# Patient Record
Sex: Male | Born: 1955 | Race: White | Hispanic: No | Marital: Married | Smoking: Never smoker
Health system: Southern US, Community
[De-identification: ages and names within clinical notes are randomized; demographics above are authoritative.]

## PROBLEM LIST (undated history)

## (undated) DIAGNOSIS — I739 Peripheral vascular disease, unspecified: Secondary | ICD-10-CM

## (undated) DIAGNOSIS — I82409 Acute embolism and thrombosis of unspecified deep veins of unspecified lower extremity: Secondary | ICD-10-CM

## (undated) DIAGNOSIS — K219 Gastro-esophageal reflux disease without esophagitis: Secondary | ICD-10-CM

## (undated) DIAGNOSIS — H269 Unspecified cataract: Secondary | ICD-10-CM

## (undated) DIAGNOSIS — E785 Hyperlipidemia, unspecified: Secondary | ICD-10-CM

## (undated) HISTORY — PX: CATARACT EXTRACTION: SUR2

## (undated) HISTORY — DX: Hyperlipidemia, unspecified: E78.5

## (undated) HISTORY — PX: OTHER SURGICAL HISTORY: SHX169

## (undated) HISTORY — PX: WISDOM TOOTH EXTRACTION: SHX21

## (undated) HISTORY — PX: UMBILICAL HERNIA REPAIR: SHX196

## (undated) HISTORY — PX: HAND SURGERY: SHX662

---

## 2006-07-14 ENCOUNTER — Ambulatory Visit (HOSPITAL_BASED_OUTPATIENT_CLINIC_OR_DEPARTMENT_OTHER): Admission: RE | Admit: 2006-07-14 | Discharge: 2006-07-14 | Payer: Self-pay | Admitting: Surgery

## 2014-12-14 ENCOUNTER — Other Ambulatory Visit: Payer: Self-pay | Admitting: Gastroenterology

## 2014-12-14 DIAGNOSIS — R1310 Dysphagia, unspecified: Secondary | ICD-10-CM

## 2014-12-20 ENCOUNTER — Ambulatory Visit
Admission: RE | Admit: 2014-12-20 | Discharge: 2014-12-20 | Disposition: A | Discharge status: Home / Self Care | Payer: Commercial Managed Care - PPO | Source: Ambulatory Visit | Attending: Gastroenterology | Admitting: Gastroenterology

## 2014-12-20 DIAGNOSIS — R1310 Dysphagia, unspecified: Secondary | ICD-10-CM

## 2015-12-25 ENCOUNTER — Ambulatory Visit (INDEPENDENT_AMBULATORY_CARE_PROVIDER_SITE_OTHER): Payer: Commercial Managed Care - PPO

## 2015-12-25 ENCOUNTER — Encounter: Payer: Self-pay | Admitting: Podiatry

## 2015-12-25 ENCOUNTER — Ambulatory Visit (INDEPENDENT_AMBULATORY_CARE_PROVIDER_SITE_OTHER): Payer: Commercial Managed Care - PPO | Admitting: Podiatry

## 2015-12-25 VITALS — BP 157/91 | HR 83 | Resp 16 | Ht 76.0 in | Wt 230.0 lb

## 2015-12-25 DIAGNOSIS — M722 Plantar fascial fibromatosis: Secondary | ICD-10-CM | POA: Diagnosis not present

## 2015-12-25 DIAGNOSIS — M79671 Pain in right foot: Secondary | ICD-10-CM

## 2015-12-25 DIAGNOSIS — B351 Tinea unguium: Secondary | ICD-10-CM

## 2015-12-25 MED ORDER — TRIAMCINOLONE ACETONIDE 10 MG/ML IJ SUSP
10.0000 mg | Freq: Once | INTRAMUSCULAR | Status: AC
  Administered 2015-12-25: 10 mg

## 2015-12-25 MED ORDER — TERBINAFINE HCL 250 MG PO TABS: 250.0000 mg | ORAL_TABLET | Freq: Every day | ORAL | Status: DC

## 2015-12-25 NOTE — Progress Notes (Signed)
Subjective:     Patient ID: Don Massey, male   DOB: Sep 29, 1956, 60 y.o.   MRN: [id number]  HPI patient states I've had pain in my right heel for around 6 months and I have dry skin and some nail disease bilateral   Review of Systems  All other systems reviewed and are negative.      Objective:   Physical Exam  Constitutional: He is oriented to person, place, and time.  Cardiovascular: Intact distal pulses.   Musculoskeletal: Normal range of motion.  Neurological: He is oriented to person, place, and time.  Skin: Skin is warm.  Nursing note and vitals reviewed.  neurovascular status intact muscle strength adequate range of motion within normal limits with patient found to have exquisite discomfort plantar aspect right heel at the insertional point tendon calcaneus with fluid buildup around the medial band and also is noted to have dry skin bilateral and nail disease with yellow thick discoloration of all nails on both feet.     Assessment:     Long-term plantar fasciitis right along with mycotic nail and skin disease bilateral    Plan:     H&P and both conditions reviewed. I discussed both conditions thoroughly and at this time I injected the right plantar fascia 3 mg Kenalog 5 mill grams Xylocaine and applied fascial brace and gave instructions on physical therapy. He is just have liver function studies which were normal and he wants to go ahead and do oral medication we will utilize 60 days of oral Lamisil to try to control skin and improve nails. If any problems were to occur he is to let us know immediately and we'll see him back in 3 weeks for checking his right heel  X-ray report indicate spur formation mild depression of arch with no indication of stress fracture

## 2015-12-25 NOTE — Patient Instructions (Signed)

## 2015-12-25 NOTE — Progress Notes (Signed)
   Subjective:    Patient ID: Don Massey, male    DOB: 1956-05-06, 60 y.o.   MRN: [id number]  HPI Patient presents with foot pain in their Right foot; heel; medial side. Pt stated, "has discomfort that started in the heel and now other parts of foot hurt"; Since Sept. 2016.   Review of Systems  All other systems reviewed and are negative.      Objective:   Physical Exam        Assessment & Plan:

## 2016-01-15 ENCOUNTER — Ambulatory Visit (INDEPENDENT_AMBULATORY_CARE_PROVIDER_SITE_OTHER): Payer: Commercial Managed Care - PPO | Admitting: Podiatry

## 2016-01-15 ENCOUNTER — Encounter: Payer: Self-pay | Admitting: Podiatry

## 2016-01-15 VITALS — BP 161/88 | HR 67 | Resp 29

## 2016-01-15 DIAGNOSIS — M779 Enthesopathy, unspecified: Secondary | ICD-10-CM

## 2016-01-15 DIAGNOSIS — M722 Plantar fascial fibromatosis: Secondary | ICD-10-CM

## 2016-01-15 MED ORDER — TRIAMCINOLONE ACETONIDE 10 MG/ML IJ SUSP
10.0000 mg | Freq: Once | INTRAMUSCULAR | Status: AC
  Administered 2016-01-15: 10 mg

## 2016-01-15 NOTE — Progress Notes (Signed)
Subjective:     Patient ID: Don Massey, male   DOB: [birth year], 60 y.o.   MRN: [id number]  HPI patient states I'm doing better but I'm having pain on the outside of my ankle now that has become more of a problem   Review of Systems     Objective:   Physical Exam Neurovascular status intact with discomfort in the lateral ankle peroneal tendon group with inflammation fluid buildup noted with significant diminishment of plantar fascial pain    Assessment:     Doing excellent with plantar fasciitis with tendinitis lateral    Plan:     Reviewed both conditions and also skin conditions with continued Lamisil usage for the next 45 days. Careful injection administered outside right foot 3 Milligan Kenalog 5 mill grams Xylocaine and advised on ice therapy reduced activity and returning to normal activity within the next several weeks. Patient will be seen back to recheck

## 2016-11-06 DIAGNOSIS — D166 Benign neoplasm of vertebral column: Secondary | ICD-10-CM | POA: Diagnosis not present

## 2016-11-06 DIAGNOSIS — D126 Benign neoplasm of colon, unspecified: Secondary | ICD-10-CM | POA: Diagnosis not present

## 2016-11-06 DIAGNOSIS — D123 Benign neoplasm of transverse colon: Secondary | ICD-10-CM | POA: Diagnosis not present

## 2016-11-06 DIAGNOSIS — Z1211 Encounter for screening for malignant neoplasm of colon: Secondary | ICD-10-CM | POA: Diagnosis not present

## 2016-11-06 DIAGNOSIS — D122 Benign neoplasm of ascending colon: Secondary | ICD-10-CM | POA: Diagnosis not present

## 2017-03-10 DIAGNOSIS — K219 Gastro-esophageal reflux disease without esophagitis: Secondary | ICD-10-CM | POA: Diagnosis not present

## 2017-03-10 DIAGNOSIS — R091 Pleurisy: Secondary | ICD-10-CM | POA: Diagnosis not present

## 2017-03-10 DIAGNOSIS — R0789 Other chest pain: Secondary | ICD-10-CM | POA: Diagnosis not present

## 2017-05-25 DIAGNOSIS — K439 Ventral hernia without obstruction or gangrene: Secondary | ICD-10-CM | POA: Diagnosis not present

## 2017-06-22 DIAGNOSIS — Z23 Encounter for immunization: Secondary | ICD-10-CM | POA: Diagnosis not present

## 2017-08-16 ENCOUNTER — Telehealth: Payer: Self-pay | Admitting: *Deleted

## 2017-08-16 MED ORDER — TERBINAFINE HCL 250 MG PO TABS: ORAL_TABLET | ORAL | 0 refills | Status: DC

## 2017-08-16 NOTE — Telephone Encounter (Signed)
Pt states 12/2015 Dr. Paulla Dolly prescribed lamisil #60 and said he may have to periodically take and pt request refill. Dr. Paulla Dolly ordered Lamisil pulse pak. Orders to [facility] and left message informing pt.

## 2017-09-21 DIAGNOSIS — Z79899 Other long term (current) drug therapy: Secondary | ICD-10-CM | POA: Diagnosis not present

## 2017-09-21 DIAGNOSIS — K219 Gastro-esophageal reflux disease without esophagitis: Secondary | ICD-10-CM | POA: Diagnosis not present

## 2017-09-21 DIAGNOSIS — E7800 Pure hypercholesterolemia, unspecified: Secondary | ICD-10-CM | POA: Diagnosis not present

## 2017-09-21 DIAGNOSIS — Z0000 Encounter for general adult medical examination without abnormal findings: Secondary | ICD-10-CM | POA: Diagnosis not present

## 2018-01-07 DIAGNOSIS — L821 Other seborrheic keratosis: Secondary | ICD-10-CM | POA: Diagnosis not present

## 2018-01-07 DIAGNOSIS — L820 Inflamed seborrheic keratosis: Secondary | ICD-10-CM | POA: Diagnosis not present

## 2018-01-07 DIAGNOSIS — D225 Melanocytic nevi of trunk: Secondary | ICD-10-CM | POA: Diagnosis not present

## 2018-01-07 DIAGNOSIS — D1801 Hemangioma of skin and subcutaneous tissue: Secondary | ICD-10-CM | POA: Diagnosis not present

## 2018-04-19 ENCOUNTER — Encounter (HOSPITAL_BASED_OUTPATIENT_CLINIC_OR_DEPARTMENT_OTHER): Payer: Self-pay

## 2018-04-19 ENCOUNTER — Other Ambulatory Visit: Payer: Self-pay

## 2018-04-19 ENCOUNTER — Emergency Department (HOSPITAL_BASED_OUTPATIENT_CLINIC_OR_DEPARTMENT_OTHER): Payer: Commercial Managed Care - PPO

## 2018-04-19 ENCOUNTER — Emergency Department (HOSPITAL_BASED_OUTPATIENT_CLINIC_OR_DEPARTMENT_OTHER)
Admission: EM | Admit: 2018-04-19 | Discharge: 2018-04-19 | Disposition: A | Discharge status: Home / Self Care | Payer: Commercial Managed Care - PPO | Attending: Emergency Medicine | Admitting: Emergency Medicine

## 2018-04-19 DIAGNOSIS — I8002 Phlebitis and thrombophlebitis of superficial vessels of left lower extremity: Secondary | ICD-10-CM | POA: Insufficient documentation

## 2018-04-19 DIAGNOSIS — I809 Phlebitis and thrombophlebitis of unspecified site: Secondary | ICD-10-CM

## 2018-04-19 DIAGNOSIS — Z79899 Other long term (current) drug therapy: Secondary | ICD-10-CM | POA: Diagnosis not present

## 2018-04-19 DIAGNOSIS — M79605 Pain in left leg: Secondary | ICD-10-CM | POA: Diagnosis present

## 2018-04-19 DIAGNOSIS — I82812 Embolism and thrombosis of superficial veins of left lower extremities: Secondary | ICD-10-CM | POA: Diagnosis not present

## 2018-04-19 MED ORDER — CEPHALEXIN 500 MG PO CAPS: 500.0000 mg | ORAL_CAPSULE | Freq: Four times a day (QID) | ORAL | 0 refills | Status: DC

## 2018-04-19 NOTE — ED Triage Notes (Signed)
Pt c/o redness, pain to left LE day 2-sent from [facility] PCP-NAD-steady gait
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
## 2018-04-19 NOTE — Discharge Instructions (Signed)
Take 4 over the counter ibuprofen tablets 3 times a day or 2 over-the-counter naproxen tablets twice a day for pain. Also take tylenol 1000mg(2 extra strength) four times a day.    

## 2018-04-19 NOTE — ED Notes (Signed)
Patient transported to Ultrasound 

## 2018-05-01 ENCOUNTER — Emergency Department (HOSPITAL_COMMUNITY)
Admission: EM | Admit: 2018-05-01 | Discharge: 2018-05-01 | Disposition: A | Discharge status: Home / Self Care | Payer: Commercial Managed Care - PPO | Attending: Emergency Medicine | Admitting: Emergency Medicine

## 2018-05-01 ENCOUNTER — Encounter (HOSPITAL_COMMUNITY): Payer: Self-pay | Admitting: Emergency Medicine

## 2018-05-01 ENCOUNTER — Emergency Department (HOSPITAL_BASED_OUTPATIENT_CLINIC_OR_DEPARTMENT_OTHER): Payer: Commercial Managed Care - PPO

## 2018-05-01 DIAGNOSIS — M79609 Pain in unspecified limb: Secondary | ICD-10-CM | POA: Diagnosis not present

## 2018-05-01 DIAGNOSIS — M79604 Pain in right leg: Secondary | ICD-10-CM | POA: Diagnosis not present

## 2018-05-01 DIAGNOSIS — R202 Paresthesia of skin: Secondary | ICD-10-CM

## 2018-05-01 DIAGNOSIS — Z79899 Other long term (current) drug therapy: Secondary | ICD-10-CM | POA: Insufficient documentation

## 2018-05-01 DIAGNOSIS — I8001 Phlebitis and thrombophlebitis of superficial vessels of right lower extremity: Secondary | ICD-10-CM | POA: Insufficient documentation

## 2018-05-01 MED ORDER — NAPROXEN 500 MG PO TABS: 500.0000 mg | ORAL_TABLET | Freq: Two times a day (BID) | ORAL | 0 refills | Status: DC

## 2018-05-01 NOTE — Discharge Instructions (Signed)
Please read and follow all provided instructions.  Your diagnoses today include:  1. Thrombophlebitis of superficial veins of right lower extremity   2. Left leg paresthesias     Tests performed today include:  Ultrasound of your right leg -superficial thrombophlebitis, no deep or blood clots  Vital signs. See below for your results today.   Medications prescribed:   Naproxen - anti-inflammatory pain medication  Do not exceed 500mg  naproxen every 12 hours, take with food  You have been prescribed an anti-inflammatory medication or NSAID. Take with food. Take smallest effective dose for the shortest duration needed for your pain. Stop taking if you experience stomach pain or vomiting.   Take any prescribed medications only as directed.  Home care instructions:  Follow any educational materials contained in this packet.  Follow-up instructions: Please follow-up with your primary care provider in the next 7 days for further evaluation of your symptoms.   Return instructions:   Please return to the Emergency Department if you experience worsening symptoms.   Please return if you have any other emergent concerns.  Additional Information:  Your vital signs today were: BP (!) 149/82 (BP Location: Left Arm)    Pulse 64    Temp 98.3 F (36.8 C) (Oral)    Resp 18    SpO2 99%  If your blood pressure (BP) was elevated above 135/85 this visit, please have this repeated by your doctor within one month. --------------

## 2018-05-01 NOTE — Progress Notes (Signed)
RLE venous duplex prelim: negative for DVT. Superficial thrombosis noted in isolated varicose vein, lower right calf. Landry Mellow, RDMS, RVT

## 2018-05-01 NOTE — ED Triage Notes (Signed)
Patient here from home with complaints of left leg redness, swelling, and tingling. Seen at [facility] PCP, referred here for Doppler study.

## 2018-05-01 NOTE — ED Provider Notes (Signed)
[facility] DEPT Provider Note   CSN: [id number] Arrival date & time: 05/01/18  1342     History   Chief Complaint Chief Complaint  Patient presents with  . Leg Pain    HPI Don Massey is a 62 y.o. male.  Patient with history of high cholesterol, chronic varicose veins --presents the emergency department today with complaint of right leg tenderness and swelling.  Patient had an area of swelling on his left leg about 2 weeks ago.  He had a Doppler ultrasound performed showing a superficial thrombophlebitis.  He was treated with anti-inflammatories and heat with improvement.  Over the past 2 days he developed similar symptoms on the right side.  He was sent to the emergency department by his primary care doctor today for Doppler ultrasound.  Patient denies any chest pain or shortness of breath.  He is not anticoagulated and does not take aspirin.  Patient reports no recent surgeries or injuries to the areas.  He was recently on a car ride to and from [place] but reports stopping frequently to stretch. The onset of this condition was acute. The course is constant. Aggravating factors: palpation. Alleviating factors: none.       History reviewed. No pertinent past medical history.  There are no active problems to display for this patient.   History reviewed. No pertinent surgical history.      Home Medications    Prior to Admission medications   Medication Sig Start Date End Date Taking? Authorizing Provider  cephALEXin (KEFLEX) 500 MG capsule Take 1 capsule (500 mg total) by mouth 4 (four) times daily. 04/19/18   Deno Etienne, DO  omeprazole (PRILOSEC) 40 MG capsule Take 40 mg by mouth daily. 11/22/15   [provider]  rosuvastatin (CRESTOR) 10 MG tablet Take 10 mg by mouth 3 (three) times a week.    [provider]    Family History No family history on file.  Social History Social History   Tobacco Use  . Smoking status:  Never Smoker  . Smokeless tobacco: Never Used  Substance Use Topics  . Alcohol use: Yes    Alcohol/week: 0.0 oz    Comment: rare  . Drug use: Never     Allergies   Codeine   Review of Systems Review of Systems  Constitutional: Negative for fever.  HENT: Negative for rhinorrhea and sore throat.   Eyes: Negative for redness.  Respiratory: Negative for cough.   Cardiovascular: Positive for leg swelling. Negative for chest pain.  Gastrointestinal: Negative for abdominal pain, diarrhea, nausea and vomiting.  Genitourinary: Negative for dysuria.  Musculoskeletal: Positive for myalgias.  Skin: Negative for rash.  Neurological: Negative for headaches.     Physical Exam Updated Vital Signs BP (!) 149/82 (BP Location: Left Arm)   Pulse 64   Temp 98.3 F (36.8 C) (Oral)   Resp 18   SpO2 99%   Physical Exam  Constitutional: He appears well-developed and well-nourished.  HENT:  Head: Normocephalic and atraumatic.  Eyes: Conjunctivae are normal.  Neck: Normal range of motion. Neck supple.  Pulmonary/Chest: No respiratory distress.  Musculoskeletal: He exhibits tenderness. He exhibits no edema.  Patient with generalized venous stasis changes and varicose veins of both legs bilaterally.  Patient has an area of tenderness to the proximal calf medially with palpable cords.  No overlying erythema.  Neurological: He is alert.  Skin: Skin is warm and dry.  Psychiatric: He has a normal mood and affect.  Nursing  note and vitals reviewed.    ED Treatments / Results  Labs (all labs ordered are listed, but only abnormal results are displayed) Labs Reviewed - No data to display  EKG None  Radiology No results found.  Procedures Procedures (including critical care time)  Medications Ordered in ED Medications - No data to display   Initial Impression / Assessment and Plan / ED Course  I have reviewed the triage vital signs and the nursing notes.  Pertinent labs & imaging  results that were available during my care of the patient were reviewed by me and considered in my medical decision making (see chart for details).     Patient seen and examined. Doppler ordered.   Vital signs reviewed and are as follows: BP (!) 149/82 (BP Location: Left Arm)   Pulse 64   Temp 98.3 F (36.8 C) (Oral)   Resp 18   SpO2 99%   4:51 PM ultrasound demonstrates superficial thrombophlebitis.  Patient will be placed back on 10-day course of naproxen.  Counseled on other conservative measures.  Encourage PCP follow-up for further instructions.  Also discussed use of compression stockings given evidence of venous insufficiency.  Final Clinical Impressions(s) / ED Diagnoses   Final diagnoses:  Thrombophlebitis of superficial veins of right lower extremity  Left leg paresthesias   Right leg tenderness and swelling: Ultrasound demonstrates thrombophlebitis, no DVT.  Left leg tingling: No other signs of stroke.  No weakness.  Patient to monitor and follow-up with PCP as needed.  We discussed signs and symptoms of stroke and when to return.  ED Discharge Orders        Ordered    naproxen (NAPROSYN) 500 MG tablet  2 times daily     05/01/18 1650       Carlisle Cater, PA-C 05/01/18 1652    Lajean Saver, MD 05/02/18 1221

## 2018-05-13 DIAGNOSIS — I809 Phlebitis and thrombophlebitis of unspecified site: Secondary | ICD-10-CM | POA: Diagnosis not present

## 2018-06-14 DIAGNOSIS — I809 Phlebitis and thrombophlebitis of unspecified site: Secondary | ICD-10-CM | POA: Diagnosis not present

## 2018-06-16 ENCOUNTER — Other Ambulatory Visit: Payer: Self-pay

## 2018-06-16 DIAGNOSIS — I83893 Varicose veins of bilateral lower extremities with other complications: Secondary | ICD-10-CM

## 2018-07-19 DIAGNOSIS — I809 Phlebitis and thrombophlebitis of unspecified site: Secondary | ICD-10-CM

## 2018-07-19 HISTORY — DX: Phlebitis and thrombophlebitis of unspecified site: I80.9

## 2018-08-05 ENCOUNTER — Other Ambulatory Visit: Payer: Self-pay

## 2018-08-05 ENCOUNTER — Encounter: Payer: Self-pay | Admitting: Vascular Surgery

## 2018-08-05 ENCOUNTER — Encounter

## 2018-08-05 ENCOUNTER — Ambulatory Visit (INDEPENDENT_AMBULATORY_CARE_PROVIDER_SITE_OTHER): Payer: Commercial Managed Care - PPO | Admitting: Vascular Surgery

## 2018-08-05 ENCOUNTER — Ambulatory Visit (HOSPITAL_COMMUNITY)
Admission: RE | Admit: 2018-08-05 | Discharge: 2018-08-05 | Disposition: A | Discharge status: Home / Self Care | Payer: Commercial Managed Care - PPO | Source: Ambulatory Visit | Attending: Vascular Surgery | Admitting: Vascular Surgery

## 2018-08-05 VITALS — BP 148/85 | HR 52 | Resp 16 | Ht 75.0 in | Wt 238.0 lb

## 2018-08-05 DIAGNOSIS — I83893 Varicose veins of bilateral lower extremities with other complications: Secondary | ICD-10-CM
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
## 2018-08-09 ENCOUNTER — Encounter: Payer: Self-pay | Admitting: Family Medicine

## 2018-10-08 DIAGNOSIS — J069 Acute upper respiratory infection, unspecified: Secondary | ICD-10-CM | POA: Diagnosis not present

## 2018-11-01 DIAGNOSIS — E7800 Pure hypercholesterolemia, unspecified: Secondary | ICD-10-CM | POA: Diagnosis not present

## 2018-11-01 DIAGNOSIS — Z0000 Encounter for general adult medical examination without abnormal findings: Secondary | ICD-10-CM | POA: Diagnosis not present

## 2018-11-01 DIAGNOSIS — K219 Gastro-esophageal reflux disease without esophagitis: Secondary | ICD-10-CM | POA: Diagnosis not present

## 2018-11-01 DIAGNOSIS — Z125 Encounter for screening for malignant neoplasm of prostate: Secondary | ICD-10-CM | POA: Diagnosis not present

## 2018-11-01 DIAGNOSIS — Z79899 Other long term (current) drug therapy: Secondary | ICD-10-CM | POA: Diagnosis not present

## 2019-04-03 ENCOUNTER — Other Ambulatory Visit: Payer: Self-pay | Admitting: Podiatry

## 2019-04-09 ENCOUNTER — Other Ambulatory Visit: Payer: Self-pay | Admitting: Podiatry

## 2019-12-22 ENCOUNTER — Other Ambulatory Visit: Payer: Self-pay | Admitting: Family Medicine

## 2019-12-22 DIAGNOSIS — R208 Other disturbances of skin sensation: Secondary | ICD-10-CM

## 2019-12-25 ENCOUNTER — Ambulatory Visit
Admission: RE | Admit: 2019-12-25 | Discharge: 2019-12-25 | Disposition: A | Discharge status: Home / Self Care | Payer: Commercial Managed Care - PPO | Source: Ambulatory Visit | Attending: Family Medicine | Admitting: Family Medicine

## 2019-12-25 DIAGNOSIS — R208 Other disturbances of skin sensation: Secondary | ICD-10-CM

## 2020-05-13 ENCOUNTER — Encounter (HOSPITAL_COMMUNITY)
Admission: RE | Admit: 2020-05-13 | Discharge: 2020-05-13 | Disposition: A | Discharge status: Home / Self Care | Payer: Commercial Managed Care - PPO | Source: Ambulatory Visit | Attending: Surgery | Admitting: Surgery

## 2020-05-13 ENCOUNTER — Other Ambulatory Visit: Payer: Self-pay

## 2020-05-13 ENCOUNTER — Encounter (HOSPITAL_COMMUNITY): Payer: Self-pay

## 2020-05-13 DIAGNOSIS — Z01812 Encounter for preprocedural laboratory examination: Secondary | ICD-10-CM | POA: Diagnosis present

## 2020-05-13 HISTORY — DX: Gastro-esophageal reflux disease without esophagitis: K21.9

## 2020-05-13 LAB — CBC
HCT: 44.3 % (ref 39.0–52.0)
Hemoglobin: 14.3 g/dL (ref 13.0–17.0)
MCH: 28.9 pg (ref 26.0–34.0)
MCHC: 32.3 g/dL (ref 30.0–36.0)
MCV: 89.7 fL (ref 80.0–100.0)
Platelets: 181 10*3/uL (ref 150–400)
RBC: 4.94 MIL/uL (ref 4.22–5.81)
RDW: 14.4 % (ref 11.5–15.5)
WBC: 5.8 10*3/uL (ref 4.0–10.5)
nRBC: 0 % (ref 0.0–0.2)

## 2020-05-13 NOTE — Patient Instructions (Addendum)
DUE TO COVID-19 ONLY ONE VISITOR ARE ALLOWED TO COME WITH YOU AND STAY IN THE WAITING ROOM ONLY DURING PRE OP AND PROCEDURE.    COVID SWAB TESTING MUST BE COMPLETED ON: Friday, May 17, 2020 at  1:35 PM [address] [place] [facility] (Must self quarantine after testing. Follow instructions on handout.)             Your procedure is scheduled on: Tuesday, Aug. 3, 2021   Report to [facility] Main  Entrance    Report to admitting at 10:00 AM   Call this number if you have problems the morning of surgery [phone]   Do not eat food :After Midnight.   May have liquids until 9:00 AM   day of surgery   CLEAR LIQUID DIET  Foods Allowed                                                                     Foods Excluded  Water, Black Coffee and tea, regular and decaf                             liquids that you cannot  Plain Jell-O in any flavor  (No red)                                           see through such as: Fruit ices (not with fruit pulp)                                     milk, soups, orange juice  Iced Popsicles (No red)                                    All solid food                                   Apple juices Sports drinks like Gatorade (No red) Lightly seasoned clear broth or consume(fat free) Sugar, honey syrup  Sample Menu Breakfast                                Lunch                                     Supper Cranberry juice                    Beef broth                            Chicken broth Jell-O  Grape juice                           Apple juice Coffee or tea                        Jell-O                                      Popsicle                                                Coffee or tea                        Coffee or tea   Oral Hygiene is also important to reduce your risk of infection.                                    Remember - BRUSH  YOUR TEETH THE MORNING OF SURGERY WITH YOUR REGULAR TOOTHPASTE   Do NOT smoke after Midnight   Take these medicines the morning of surgery with A SIP OF WATER: Omeprazole                               You may not have any metal on your body including  jewelry, and body piercings             Do not wear lotions, powders, perfumes/cologne, or deodorant                          Men may shave face and neck.   Do not bring valuables to the hospital. [place].   Contacts, dentures or bridgework may not be worn into surgery.    Patients discharged the day of surgery will not be allowed to drive home.   Special Instructions: Bring a copy of your healthcare power of attorney and living will documents         the day of surgery if you haven't scanned them in before.              Please read over the following fact sheets you were given: IF YOU HAVE QUESTIONS ABOUT YOUR PRE OP INSTRUCTIONS PLEASE CALL [phone]   [facility] - Preparing for Surgery Before surgery, you can play an important role.  Because skin is not sterile, your skin needs to be as free of germs as possible.  You can reduce the number of germs on your skin by washing with CHG (chlorahexidine gluconate) soap before surgery.  CHG is an antiseptic cleaner which kills germs and bonds with the skin to continue killing germs even after washing. Please DO NOT use if you have an allergy to CHG or antibacterial soaps.  If your skin becomes reddened/irritated stop using the CHG and inform your nurse when you arrive at Short Stay. Do not shave (including legs and underarms) for at least 48 hours  prior to the first CHG shower.  You may shave your face/neck.  Please follow these instructions carefully:  1.  Shower with CHG Soap the night before surgery and the  morning of surgery.  2.  If you choose to wash your hair, wash your hair first as usual with your normal  shampoo.  3.  After you  shampoo, rinse your hair and body thoroughly to remove the shampoo.                             4.  Use CHG as you would any other liquid soap.  You can apply chg directly to the skin and wash.  Gently with a scrungie or clean washcloth.  5.  Apply the CHG Soap to your body ONLY FROM THE NECK DOWN.   Do   not use on face/ open                           Wound or open sores. Avoid contact with eyes, ears mouth and   genitals (private parts).                       Wash face,  Genitals (private parts) with your normal soap.             6.  Wash thoroughly, paying special attention to the area where your    surgery  will be performed.  7.  Thoroughly rinse your body with warm water from the neck down.  8.  DO NOT shower/wash with your normal soap after using and rinsing off the CHG Soap.                9.  Pat yourself dry with a clean towel.            10.  Wear clean pajamas.            11.  Place clean sheets on your bed the night of your first shower and do not  sleep with pets. Day of Surgery : Do not apply any lotions/deodorants the morning of surgery.  Please wear clean clothes to the hospital/surgery center.  FAILURE TO FOLLOW THESE INSTRUCTIONS MAY RESULT IN THE CANCELLATION OF YOUR SURGERY  PATIENT SIGNATURE_________________________________  NURSE SIGNATURE__________________________________  ________________________________________________________________________

## 2020-05-13 NOTE — Progress Notes (Signed)
COVID Vaccine Completed: x2 Date COVID Vaccine completed:  January 06, 2020 &January 27, 2020 COVID vaccine manufacturer: Pfizer     PCP - Dr. Gaynelle Arabian Cardiologist - Saw cardiology 20 years ago  Chest x-ray -  N/A  EKG -  N/A  Stress Test - 20+ years ago ECHO -  N/A  Cardiac Cath -  N/A   Sleep Study -  N/A  CPAP -  N/A   Fasting Blood Sugar - N/A Checks Blood Sugar _____ times a day  Blood Thinner Instructions: N/A  Aspirin Instructions: N/A  Last Dose: N/A   Anesthesia review:   Patient denies shortness of breath, fever, cough and chest pain at PAT appointment   Patient verbalized understanding of instructions that were given to them at the PAT appointment. Patient was also instructed that they will need to review over the PAT instructions again at home before surgery.

## 2020-05-14 ENCOUNTER — Ambulatory Visit: Payer: Self-pay | Admitting: Surgery

## 2020-05-17 ENCOUNTER — Other Ambulatory Visit (HOSPITAL_COMMUNITY)
Admission: RE | Admit: 2020-05-17 | Discharge: 2020-05-17 | Disposition: A | Discharge status: Home / Self Care | Payer: Commercial Managed Care - PPO | Source: Ambulatory Visit | Attending: Surgery | Admitting: Surgery

## 2020-05-17 DIAGNOSIS — Z01812 Encounter for preprocedural laboratory examination: Secondary | ICD-10-CM | POA: Diagnosis not present

## 2020-05-17 DIAGNOSIS — Z20822 Contact with and (suspected) exposure to covid-19: Secondary | ICD-10-CM | POA: Insufficient documentation

## 2020-05-17 LAB — SARS CORONAVIRUS 2 (TAT 6-24 HRS): SARS Coronavirus 2: NEGATIVE

## 2020-05-20 NOTE — H&P (Signed)
Chief Complaint:  Recurrent umblical hernia  History of Present Illness:  Don Massey is an 64 y.o. male on whom I have previously repaired an umbilical hernia.  It has recently recurred and is uncomfortable.   Past Medical History:  Diagnosis Date  . GERD (gastroesophageal reflux disease)   . Hyperlipidemia   . Thrombophlebitis 07/2018   Bilateral legs superficial    Past Surgical History:  Procedure Laterality Date  . HAND SURGERY Right    Cyst removal  . skin grafts     Legs and arms  . UMBILICAL HERNIA REPAIR    . WISDOM TOOTH EXTRACTION      No current facility-administered medications for this encounter.   Current Outpatient Medications  Medication Sig Dispense Refill  . Multiple Vitamin (MULTIVITAMIN) tablet Take 1 tablet by mouth daily.    Marland Kitchen omeprazole (PRILOSEC) 40 MG capsule Take 40 mg by mouth daily.  0  . Polyethyl Glycol-Propyl Glycol (SYSTANE) 0.4-0.3 % SOLN Apply 1 drop to eye daily as needed (dry eyes).    . rosuvastatin (CRESTOR) 10 MG tablet Take 10 mg by mouth 3 (three) times a week. Takes on Mon, Wed and Fri    . cephALEXin (KEFLEX) 500 MG capsule Take 1 capsule (500 mg total) by mouth 4 (four) times daily. (Patient not taking: Reported on 05/13/2020) 40 capsule 0  . naproxen (NAPROSYN) 500 MG tablet Take 1 tablet (500 mg total) by mouth 2 (two) times daily. (Patient not taking: Reported on 05/13/2020) 20 tablet 0   Codeine No family history on file. Social History:   reports that he has never smoked. He has never used smokeless tobacco. He reports current alcohol use. He reports that he does not use drugs.   REVIEW OF SYSTEMS : Negative except for see problem list  Physical Exam:   There were no vitals taken for this visit. There is no height or weight on file to calculate BMI.  Gen:  WDWN WM NAD  Neurological: Alert and oriented to person, place, and time. Motor and sensory function is grossly intact  Head: Normocephalic and atraumatic.  Eyes:  Conjunctivae are normal. Pupils are equal, round, and reactive to light. No scleral icterus.  Neck: Normal range of motion. Neck supple. No tracheal deviation or thyromegaly present.  Cardiovascular:  SR without murmurs or gallops.  No carotid bruits Breast:  Not examined Respiratory: Effort normal.  No respiratory distress. No chest wall tenderness. Breath sounds normal.  No wheezes, rales or rhonchi.  Abdomen:  Recurrent protrusion about the umbilicus GU:  Not examined Musculoskeletal: Normal range of motion. Extremities are nontender. No cyanosis, edema or clubbing noted Lymphadenopathy: No cervical, preauricular, postauricular or axillary adenopathy is present Skin: Skin is warm and dry. No rash noted. No diaphoresis. No erythema. No pallor. Pscyh: Normal mood and affect. Behavior is normal. Judgment and thought content normal.   LABORATORY RESULTS: No results found for this or any previous visit (from the past 48 hour(s)).   RADIOLOGY RESULTS: No results found.  Problem List: There are no problems to display for this patient.   Assessment & Plan: Recurrent umbilical hernia for repair    [name] Done, MD, [facility] [phone] beeper [phone]  05/20/2020 9:52 PM

## 2020-05-20 NOTE — Progress Notes (Signed)
Spoke with pts wife. Aware to arrive at [facility] admitting at 0930 8/3/2021for pts scheduled surgical procedure. Pts wife aware no food after midnight; clear liquid diet from midnight till 0830 for pt.

## 2020-05-21 ENCOUNTER — Encounter (HOSPITAL_COMMUNITY)
Admission: RE | Disposition: A | Discharge status: Home / Self Care | Payer: Self-pay | Source: Ambulatory Visit | Attending: Surgery

## 2020-05-21 ENCOUNTER — Other Ambulatory Visit: Payer: Self-pay

## 2020-05-21 ENCOUNTER — Ambulatory Visit (HOSPITAL_COMMUNITY)
Admission: RE | Admit: 2020-05-21 | Discharge: 2020-05-21 | Disposition: A | Discharge status: Home / Self Care | Payer: Commercial Managed Care - PPO | Source: Ambulatory Visit | Attending: Surgery | Admitting: Surgery

## 2020-05-21 ENCOUNTER — Encounter (HOSPITAL_COMMUNITY): Payer: Self-pay | Admitting: Surgery

## 2020-05-21 ENCOUNTER — Ambulatory Visit (HOSPITAL_COMMUNITY): Payer: Commercial Managed Care - PPO | Admitting: Certified Registered"

## 2020-05-21 DIAGNOSIS — K430 Incisional hernia with obstruction, without gangrene: Secondary | ICD-10-CM | POA: Insufficient documentation

## 2020-05-21 DIAGNOSIS — Z79899 Other long term (current) drug therapy: Secondary | ICD-10-CM | POA: Insufficient documentation

## 2020-05-21 DIAGNOSIS — K436 Other and unspecified ventral hernia with obstruction, without gangrene: Secondary | ICD-10-CM | POA: Diagnosis not present

## 2020-05-21 DIAGNOSIS — E785 Hyperlipidemia, unspecified: Secondary | ICD-10-CM | POA: Diagnosis not present

## 2020-05-21 DIAGNOSIS — K219 Gastro-esophageal reflux disease without esophagitis: Secondary | ICD-10-CM | POA: Diagnosis not present

## 2020-05-21 HISTORY — PX: UMBILICAL HERNIA REPAIR: SHX196

## 2020-05-21 SURGERY — REPAIR, HERNIA, UMBILICAL, LAPAROSCOPIC
Anesthesia: General | Site: Abdomen

## 2020-05-21 MED ORDER — LACTATED RINGERS IV SOLN: INTRAVENOUS | Status: DC

## 2020-05-21 MED ORDER — KETOROLAC TROMETHAMINE 30 MG/ML IJ SOLN: 30.0000 mg | Freq: Once | INTRAMUSCULAR | Status: AC | PRN

## 2020-05-21 MED ORDER — LIDOCAINE 2% (20 MG/ML) 5 ML SYRINGE
INTRAMUSCULAR | Status: AC
  Filled 2020-05-21: qty 5

## 2020-05-21 MED ORDER — GLYCOPYRROLATE PF 0.2 MG/ML IJ SOSY
PREFILLED_SYRINGE | INTRAMUSCULAR | Status: AC
  Filled 2020-05-21: qty 1

## 2020-05-21 MED ORDER — CHLORHEXIDINE GLUCONATE CLOTH 2 % EX PADS: 6.0000 | MEDICATED_PAD | Freq: Once | CUTANEOUS | Status: DC

## 2020-05-21 MED ORDER — LIDOCAINE 2% (20 MG/ML) 5 ML SYRINGE
INTRAMUSCULAR | Status: DC | PRN
  Administered 2020-05-21: 40 mg via INTRAVENOUS

## 2020-05-21 MED ORDER — HYDROCODONE-ACETAMINOPHEN 5-325 MG PO TABS: 1.0000 | ORAL_TABLET | Freq: Four times a day (QID) | ORAL | 0 refills | Status: DC | PRN

## 2020-05-21 MED ORDER — MIDAZOLAM HCL 2 MG/2ML IJ SOLN
INTRAMUSCULAR | Status: AC
  Filled 2020-05-21: qty 2

## 2020-05-21 MED ORDER — GLYCOPYRROLATE 0.2 MG/ML IJ SOLN
INTRAMUSCULAR | Status: DC | PRN
  Administered 2020-05-21 (×2): .1 mg via INTRAVENOUS

## 2020-05-21 MED ORDER — SUGAMMADEX SODIUM 200 MG/2ML IV SOLN
INTRAVENOUS | Status: DC | PRN
  Administered 2020-05-21: 200 mg via INTRAVENOUS

## 2020-05-21 MED ORDER — MIDAZOLAM HCL 2 MG/2ML IJ SOLN
INTRAMUSCULAR | Status: DC | PRN
  Administered 2020-05-21: 2 mg via INTRAVENOUS

## 2020-05-21 MED ORDER — EPHEDRINE SULFATE-NACL 50-0.9 MG/10ML-% IV SOSY
PREFILLED_SYRINGE | INTRAVENOUS | Status: DC | PRN
  Administered 2020-05-21 (×2): 5 mg via INTRAVENOUS

## 2020-05-21 MED ORDER — EPHEDRINE 5 MG/ML INJ
INTRAVENOUS | Status: AC
  Filled 2020-05-21: qty 10

## 2020-05-21 MED ORDER — DEXAMETHASONE SODIUM PHOSPHATE 10 MG/ML IJ SOLN
INTRAMUSCULAR | Status: DC | PRN
  Administered 2020-05-21: 4 mg via INTRAVENOUS

## 2020-05-21 MED ORDER — PROPOFOL 10 MG/ML IV BOLUS
INTRAVENOUS | Status: DC | PRN
  Administered 2020-05-21: 200 mg via INTRAVENOUS

## 2020-05-21 MED ORDER — LACTATED RINGERS IR SOLN
Status: DC | PRN
  Administered 2020-05-21: 1000 mL

## 2020-05-21 MED ORDER — FENTANYL CITRATE (PF) 100 MCG/2ML IJ SOLN: 25.0000 ug | INTRAMUSCULAR | Status: DC | PRN

## 2020-05-21 MED ORDER — CHLORHEXIDINE GLUCONATE 0.12 % MT SOLN
15.0000 mL | Freq: Once | OROMUCOSAL | Status: AC
  Administered 2020-05-21: 15 mL via OROMUCOSAL

## 2020-05-21 MED ORDER — ROCURONIUM BROMIDE 10 MG/ML (PF) SYRINGE
PREFILLED_SYRINGE | INTRAVENOUS | Status: AC
  Filled 2020-05-21: qty 10

## 2020-05-21 MED ORDER — CEFAZOLIN SODIUM-DEXTROSE 2-4 GM/100ML-% IV SOLN
2.0000 g | INTRAVENOUS | Status: AC
  Administered 2020-05-21: 2 g via INTRAVENOUS
  Filled 2020-05-21: qty 100

## 2020-05-21 MED ORDER — 0.9 % SODIUM CHLORIDE (POUR BTL) OPTIME
TOPICAL | Status: DC | PRN
  Administered 2020-05-21: 1000 mL

## 2020-05-21 MED ORDER — ROCURONIUM BROMIDE 10 MG/ML (PF) SYRINGE
PREFILLED_SYRINGE | INTRAVENOUS | Status: DC | PRN
  Administered 2020-05-21: 5 mg via INTRAVENOUS
  Administered 2020-05-21: 10 mg via INTRAVENOUS
  Administered 2020-05-21: 5 mg via INTRAVENOUS
  Administered 2020-05-21: 70 mg via INTRAVENOUS

## 2020-05-21 MED ORDER — DEXAMETHASONE SODIUM PHOSPHATE 10 MG/ML IJ SOLN
INTRAMUSCULAR | Status: AC
  Filled 2020-05-21: qty 1

## 2020-05-21 MED ORDER — FENTANYL CITRATE (PF) 100 MCG/2ML IJ SOLN
INTRAMUSCULAR | Status: AC
  Filled 2020-05-21: qty 2

## 2020-05-21 MED ORDER — ONDANSETRON HCL 4 MG/2ML IJ SOLN
INTRAMUSCULAR | Status: AC
  Filled 2020-05-21: qty 2

## 2020-05-21 MED ORDER — BUPIVACAINE LIPOSOME 1.3 % IJ SUSP
20.0000 mL | Freq: Once | INTRAMUSCULAR | Status: AC
  Administered 2020-05-21: 20 mL
  Filled 2020-05-21: qty 20

## 2020-05-21 MED ORDER — PROPOFOL 10 MG/ML IV BOLUS
INTRAVENOUS | Status: AC
  Filled 2020-05-21: qty 20

## 2020-05-21 MED ORDER — FENTANYL CITRATE (PF) 250 MCG/5ML IJ SOLN
INTRAMUSCULAR | Status: DC | PRN
  Administered 2020-05-21: 100 ug via INTRAVENOUS
  Administered 2020-05-21 (×2): 25 ug via INTRAVENOUS

## 2020-05-21 MED ORDER — ORAL CARE MOUTH RINSE: 15.0000 mL | Freq: Once | OROMUCOSAL | Status: AC

## 2020-05-21 MED ORDER — KETOROLAC TROMETHAMINE 30 MG/ML IJ SOLN
INTRAMUSCULAR | Status: AC
  Administered 2020-05-21: 30 mg via INTRAVENOUS
  Filled 2020-05-21: qty 1

## 2020-05-21 MED ORDER — ONDANSETRON HCL 4 MG/2ML IJ SOLN
INTRAMUSCULAR | Status: DC | PRN
  Administered 2020-05-21: 4 mg via INTRAVENOUS

## 2020-05-21 SURGICAL SUPPLY — 41 items
ADH SKN CLS APL DERMABOND .7 (GAUZE/BANDAGES/DRESSINGS) ×1
BINDER ABDOMINAL 12 ML 46-62 (SOFTGOODS) ×2 IMPLANT
COVER SURGICAL LIGHT HANDLE (MISCELLANEOUS) ×2 IMPLANT
COVER WAND RF STERILE (DRAPES) IMPLANT
DECANTER SPIKE VIAL GLASS SM (MISCELLANEOUS) IMPLANT
DERMABOND ADVANCED (GAUZE/BANDAGES/DRESSINGS) ×1
DERMABOND ADVANCED .7 DNX12 (GAUZE/BANDAGES/DRESSINGS) ×1 IMPLANT
DEVICE SECURE STRAP 25 ABSORB (INSTRUMENTS) ×2 IMPLANT
DEVICE TROCAR PUNCTURE CLOSURE (ENDOMECHANICALS) IMPLANT
DISSECTOR BLUNT TIP ENDO 5MM (MISCELLANEOUS) IMPLANT
DRAIN CHANNEL 19F RND (DRAIN) IMPLANT
ELECT REM PT RETURN 15FT ADLT (MISCELLANEOUS) ×2 IMPLANT
EVACUATOR SILICONE 100CC (DRAIN) IMPLANT
GLOVE BIOGEL M 8.0 STRL (GLOVE) ×2 IMPLANT
GOWN SPEC L4 XLG W/TWL (GOWN DISPOSABLE) ×2 IMPLANT
GOWN STRL REUS W/TWL XL LVL3 (GOWN DISPOSABLE) ×4 IMPLANT
KIT BASIN OR (CUSTOM PROCEDURE TRAY) ×2 IMPLANT
KIT TURNOVER KIT A (KITS) IMPLANT
MARKER SKIN DUAL TIP RULER LAB (MISCELLANEOUS) IMPLANT
MESH VENTRALEX ST 8CM LRG (Mesh General) ×2 IMPLANT
NEEDLE SPNL 22GX3.5 QUINCKE BK (NEEDLE) IMPLANT
PAD POSITIONING PINK XL (MISCELLANEOUS) ×2 IMPLANT
PENCIL SMOKE EVACUATOR (MISCELLANEOUS) IMPLANT
PROTECTOR NERVE ULNAR (MISCELLANEOUS) IMPLANT
SCISSORS LAP 5X45 EPIX DISP (ENDOMECHANICALS) ×2 IMPLANT
SET IRRIG TUBING LAPAROSCOPIC (IRRIGATION / IRRIGATOR) ×2 IMPLANT
SET TUBE SMOKE EVAC HIGH FLOW (TUBING) ×2 IMPLANT
SLEEVE XCEL OPT CAN 5 100 (ENDOMECHANICALS) ×2 IMPLANT
STAPLER VISISTAT 35W (STAPLE) IMPLANT
STRIP CLOSURE SKIN 1/2X4 (GAUZE/BANDAGES/DRESSINGS) IMPLANT
SUT NOVA 0 T19/GS 22DT (SUTURE) IMPLANT
SUT NOVA NAB DX-16 0-1 5-0 T12 (SUTURE) ×2 IMPLANT
SUT NOVA NAB GS-21 0 18 T12 DT (SUTURE) ×2 IMPLANT
SUT VIC AB 4-0 SH 18 (SUTURE) ×2 IMPLANT
TACKER 5MM HERNIA 3.5CML NAB (ENDOMECHANICALS) IMPLANT
TOWEL OR 17X26 10 PK STRL BLUE (TOWEL DISPOSABLE) ×2 IMPLANT
TOWEL OR NON WOVEN STRL DISP B (DISPOSABLE) ×2 IMPLANT
TRAY FOLEY MTR SLVR 16FR STAT (SET/KITS/TRAYS/PACK) IMPLANT
TRAY LAPAROSCOPIC (CUSTOM PROCEDURE TRAY) ×2 IMPLANT
TROCAR BLADELESS OPT 5 100 (ENDOMECHANICALS) ×2 IMPLANT
TROCAR XCEL NON-BLD 11X100MML (ENDOMECHANICALS) IMPLANT

## 2020-05-21 NOTE — Transfer of Care (Signed)
Immediate Anesthesia Transfer of Care Note  Patient: Don Massey  Procedure(s) Performed: LAPAROSCOPIC ASSISTED RECURRENT UMBILICAL HERNIA REPAIR WITH MESH (N/A Abdomen)  Patient Location: PACU  Anesthesia Type:General  Level of Consciousness: drowsy and responds to stimulation  Airway & Oxygen Therapy: Patient Spontanous Breathing and Patient connected to face mask oxygen  Post-op Assessment: Report given to RN and Post -op Vital signs reviewed and stable  Post vital signs: Reviewed and stable  Last Vitals:  Vitals Value Taken Time  BP 157/85 05/21/20 1333  Temp    Pulse 74 05/21/20 1334  Resp    SpO2 100 % 05/21/20 1334  Vitals shown include unvalidated device data.  Last Pain:  Vitals:   05/21/20 0952  TempSrc:   PainSc: 0-No pain      Patients Stated Pain Goal: 4 ([id number])  Complications: No complications documented.

## 2020-05-21 NOTE — Anesthesia Procedure Notes (Signed)
Procedure Name: Intubation Date/Time: 05/21/2020 11:34 AM Performed by: Eben Burow, CRNA Pre-anesthesia Checklist: Patient identified, Emergency Drugs available, Suction available, Patient being monitored and Timeout performed Patient Re-evaluated:Patient Re-evaluated prior to induction Oxygen Delivery Method: Circle system utilized Preoxygenation: Pre-oxygenation with 100% oxygen Induction Type: IV induction Ventilation: Mask ventilation without difficulty Laryngoscope Size: Mac and 4 Grade View: Grade I Tube type: Oral Tube size: 7.5 mm Number of attempts: 1 Airway Equipment and Method: Stylet Placement Confirmation: ETT inserted through vocal cords under direct vision,  positive ETCO2 and breath sounds checked- equal and bilateral Secured at: 23 cm Tube secured with: Tape Dental Injury: Teeth and Oropharynx as per pre-operative assessment

## 2020-05-21 NOTE — Anesthesia Postprocedure Evaluation (Signed)
Anesthesia Post Note  Patient: Don Massey  Procedure(s) Performed: LAPAROSCOPIC ASSISTED RECURRENT UMBILICAL HERNIA REPAIR WITH MESH (N/A Abdomen)     Patient location during evaluation: PACU Anesthesia Type: General Level of consciousness: awake and alert Pain management: pain level controlled Vital Signs Assessment: post-procedure vital signs reviewed and stable Respiratory status: spontaneous breathing, nonlabored ventilation, respiratory function stable and patient connected to nasal cannula oxygen Cardiovascular status: blood pressure returned to baseline and stable Postop Assessment: no apparent nausea or vomiting Anesthetic complications: no   No complications documented.  Last Vitals:  Vitals:   05/21/20 1415 05/21/20 1445  BP: 140/87 (!) 148/77  Pulse: 68 (!) 56  Resp: 12   Temp:  36.6 C  SpO2: 99% 99%    Last Pain:  Vitals:   05/21/20 1445  TempSrc:   PainSc: 4                  [name] S

## 2020-05-21 NOTE — Discharge Instructions (Signed)
Ventral Hernia  A ventral hernia is a bulge of tissue from inside the abdomen that pushes through a weak area of the muscles that form the front wall of the abdomen. The tissues inside the abdomen are inside a sac (peritoneum). These tissues include the small intestine, large intestine, and the fatty tissue that covers the intestines (omentum). Sometimes, the bulge that forms a hernia contains intestines. Other hernias contain only fat. Ventral hernias do not go away without surgical treatment. There are several types of ventral hernias. You may have:  A hernia at an incision site from previous abdominal surgery (incisional hernia).  A hernia just above the belly button (epigastric hernia), or at the belly button (umbilical hernia). These types of hernias can develop from heavy lifting or straining.  A hernia that comes and goes (reducible hernia). It may be visible only when you lift or strain. This type of hernia can be pushed back into the abdomen (reduced).  A hernia that traps abdominal tissue inside the hernia (incarcerated hernia). This type of hernia does not reduce.  A hernia that cuts off blood flow to the tissues inside the hernia (strangulated hernia). The tissues can start to die if this happens. This is a very painful bulge that cannot be reduced. A strangulated hernia is a medical emergency. What are the causes? This condition is caused by abdominal tissue putting pressure on an area of weakness in the abdominal muscles. What increases the risk? The following factors may make you more likely to develop this condition:  Being male.  Being 60 or older.  Being overweight or obese.  Having had previous abdominal surgery, especially if there was an infection after surgery.  Having had an injury to the abdominal wall.  Having had several pregnancies.  Having a buildup of fluid inside the abdomen (ascites). What are the signs or symptoms? The only symptom of a ventral hernia  may be a painless bulge in the abdomen. A reducible hernia may be visible only when you strain, cough, or lift. Other symptoms may include:  Dull pain.  A feeling of pressure. Signs and symptoms of a strangulated hernia may include:  Increasing pain.  Nausea and vomiting.  Pain when pressing on the hernia.  The skin over the hernia turning red or purple.  Constipation.  Blood in the stool (feces). How is this diagnosed? This condition may be diagnosed based on:  Your symptoms.  Your medical history.  A physical exam. You may be asked to cough or strain while standing. These actions increase the pressure inside your abdomen and force the hernia through the opening in your muscles. Your health care provider may try to reduce the hernia by pressing on it.  Imaging studies, such as an ultrasound or CT scan. How is this treated? This condition is treated with surgery. If you have a strangulated hernia, surgery is done as soon as possible. If your hernia is small and not incarcerated, you may be asked to lose some weight before surgery. Follow these instructions at home:  Follow instructions from your health care provider about eating or drinking restrictions.  If you are overweight, your health care provider may recommend that you increase your activity level and eat a healthier diet.  Do not lift anything that is heavier than 10 lb (4.5 kg).  Return to your normal activities as told by your health care provider. Ask your health care provider what activities are safe for you. You may need to avoid activities   that increase pressure on your hernia.  Take over-the-counter and prescription medicines only as told by your health care provider.  Keep all follow-up visits as told by your health care provider. This is important. Contact a health care provider if:  Your hernia gets larger.  Your hernia becomes painful. Get help right away if:  Your hernia becomes increasingly  painful.  You have pain along with any of the following: ? Changes in skin color in the area of the hernia. ? Nausea. ? Vomiting. ? Fever. Summary  A ventral hernia is a bulge of tissue from inside the abdomen that pushes through a weak area of the muscles that form the front wall of the abdomen.  This condition is treated with surgery, which may be urgent depending on your hernia.  Do not lift anything that is heavier than 10 lb (4.5 kg), and follow activity instructions from your health care provider. This information is not intended to replace advice given to you by your health care provider. Make sure you discuss any questions you have with your health care provider. Document Revised: 11/17/2017 Document Reviewed: 04/26/2017 Elsevier Patient Education  2020 [employer]  

## 2020-05-21 NOTE — Op Note (Addendum)
Don Massey  Jan 01, 1956   05/21/2020    PCP:  Gaynelle Arabian, MD   Surgeon: Kaylyn Lim, MD, FACS  Asst:  none  Anes:  general  Preop Dx: Painful supraumbilical hernia Postop Dx: Incarcerated fat herniated in supraumbilical location   Procedure: Lap assisted supraumbilical hernia repair Location Surgery: [facility] Complications: None noted  EBL:   minimal cc  Drains: none  Description of Procedure:  The patient was taken to OR 1 .  After anesthesia was administered and the patient was prepped  with chloroprep  and a timeout was performed.  Access to the abdomen was achieved with a 5 mm Optiview through the left upper quadrant.  Following insufflation the tender area with the palpable knot which was above his umbilical region could be manipulated and a dimple would fluctuate in this fatty mass that appeared to be herniating up through the fascia into the anterior rectus region.  I made a transverse incision at the spot that had previously marked slightly above that we took this down to the fascia.  In the area where he was was noticing the herniation of the fat I cut down and encountered this very large fat mass which brought out through this transverse incision and removed.  Hemostasis was present.  Digital exam of the prior hernia repair and the other areas of the defect were examined.  The prior repair was intact and was imperceptible to laparoscopic and digital exam.  An 8 cm Ventralex mesh patch appeared to close this attenuated area.  This  defect was closed with a an 8 cm Ventralex mesh patch which I inserted and incorporated into a transverse closure using interrupted 0 Novafil's.  When this had been tied down and the defect closed,  I went back in with the scope and then attached the perimeter of the ventral Lex with the secure strap absorbable tacker.  The mesh seemed to completely cover the area and the area was infiltrated with some Exparel full-strength along with the the two 5  mm ports.  Ports were then closed after inspection of the abdomen including exam of the liver, gallbladder and gross inspection of the bowel. I deflated the abdomen and  removed the trochars and closed all with subcuticular Monocryl and Dermabond.  Abdominal binder was placed.  The patient will be discharged home.  The patient tolerated the procedure well and was taken to the PACU in stable condition.     [name] Done, [place], [facility], [place]

## 2020-05-21 NOTE — Anesthesia Preprocedure Evaluation (Signed)
Anesthesia Evaluation  Patient identified by MRN, date of birth, ID band Patient awake    Reviewed: Allergy & Precautions, NPO status , Patient's Chart, lab work & pertinent test results  Airway Mallampati: II  TM Distance: >3 FB Neck ROM: Full    Dental no notable dental hx.    Pulmonary neg pulmonary ROS,    Pulmonary exam normal breath sounds clear to auscultation       Cardiovascular negative cardio ROS Normal cardiovascular exam Rhythm:Regular Rate:Normal     Neuro/Psych negative neurological ROS  negative psych ROS   GI/Hepatic Neg liver ROS, GERD  Medicated,  Endo/Other  negative endocrine ROS  Renal/GU negative Renal ROS  negative genitourinary   Musculoskeletal negative musculoskeletal ROS (+)   Abdominal   Peds negative pediatric ROS (+)  Hematology negative hematology ROS (+)   Anesthesia Other Findings   Reproductive/Obstetrics negative OB ROS                             Anesthesia Physical Anesthesia Plan  ASA: II  Anesthesia Plan: General   Post-op Pain Management:    Induction: Intravenous  PONV Risk Score and Plan: 2 and Ondansetron, Dexamethasone and Treatment may vary due to age or medical condition  Airway Management Planned: Oral ETT  Additional Equipment:   Intra-op Plan:   Post-operative Plan: Extubation in OR  Informed Consent: I have reviewed the patients History and Physical, chart, labs and discussed the procedure including the risks, benefits and alternatives for the proposed anesthesia with the patient or authorized representative who has indicated his/her understanding and acceptance.     Dental advisory given  Plan Discussed with: CRNA and Surgeon  Anesthesia Plan Comments:         Anesthesia Quick Evaluation

## 2020-05-22 ENCOUNTER — Encounter (HOSPITAL_COMMUNITY): Payer: Self-pay | Admitting: Surgery

## 2020-08-03 ENCOUNTER — Encounter (HOSPITAL_BASED_OUTPATIENT_CLINIC_OR_DEPARTMENT_OTHER): Payer: Self-pay | Admitting: Emergency Medicine

## 2020-08-03 ENCOUNTER — Emergency Department (HOSPITAL_BASED_OUTPATIENT_CLINIC_OR_DEPARTMENT_OTHER): Payer: Commercial Managed Care - PPO

## 2020-08-03 ENCOUNTER — Emergency Department (HOSPITAL_BASED_OUTPATIENT_CLINIC_OR_DEPARTMENT_OTHER)
Admission: EM | Admit: 2020-08-03 | Discharge: 2020-08-03 | Disposition: A | Discharge status: Home / Self Care | Payer: Commercial Managed Care - PPO | Attending: Emergency Medicine | Admitting: Emergency Medicine

## 2020-08-03 ENCOUNTER — Other Ambulatory Visit: Payer: Self-pay

## 2020-08-03 DIAGNOSIS — Z8672 Personal history of thrombophlebitis: Secondary | ICD-10-CM | POA: Diagnosis not present

## 2020-08-03 DIAGNOSIS — M79605 Pain in left leg: Secondary | ICD-10-CM | POA: Diagnosis present

## 2020-08-03 DIAGNOSIS — I824Y2 Acute embolism and thrombosis of unspecified deep veins of left proximal lower extremity: Secondary | ICD-10-CM | POA: Diagnosis not present

## 2020-08-03 HISTORY — DX: Unspecified cataract: H26.9

## 2020-08-03 MED ORDER — RIVAROXABAN 15 MG PO TABS
ORAL_TABLET | ORAL | Status: AC
  Administered 2020-08-03: 15 mg via ORAL
  Filled 2020-08-03: qty 1

## 2020-08-03 MED ORDER — RIVAROXABAN 15 MG PO TABS: 15.0000 mg | ORAL_TABLET | Freq: Once | ORAL | Status: AC

## 2020-08-03 MED ORDER — RIVAROXABAN (XARELTO) VTE STARTER PACK (15 & 20 MG)
ORAL_TABLET | ORAL | 0 refills | Status: DC
Start: 2020-08-03 — End: 2020-08-26
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
## 2020-08-03 NOTE — Discharge Instructions (Addendum)
Follow up with your family doc.  Return for chest pain, sob, feeling like you may pass out.

## 2020-08-03 NOTE — ED Triage Notes (Signed)
Left low leg pain and swelling x6 days.  Went to UC and they had high suspicion for DVT.  Sent him to ED for [place].

## 2020-08-04 ENCOUNTER — Other Ambulatory Visit: Payer: Self-pay

## 2020-08-04 ENCOUNTER — Emergency Department (HOSPITAL_BASED_OUTPATIENT_CLINIC_OR_DEPARTMENT_OTHER)
Admission: EM | Admit: 2020-08-04 | Discharge: 2020-08-04 | Disposition: A | Discharge status: Home / Self Care | Payer: Commercial Managed Care - PPO | Attending: Emergency Medicine | Admitting: Emergency Medicine

## 2020-08-04 ENCOUNTER — Encounter (HOSPITAL_BASED_OUTPATIENT_CLINIC_OR_DEPARTMENT_OTHER): Payer: Self-pay | Admitting: Emergency Medicine

## 2020-08-04 DIAGNOSIS — I824Y2 Acute embolism and thrombosis of unspecified deep veins of left proximal lower extremity: Secondary | ICD-10-CM

## 2020-08-04 DIAGNOSIS — I82402 Acute embolism and thrombosis of unspecified deep veins of left lower extremity: Secondary | ICD-10-CM | POA: Diagnosis not present

## 2020-08-04 DIAGNOSIS — Z7901 Long term (current) use of anticoagulants: Secondary | ICD-10-CM | POA: Insufficient documentation

## 2020-08-04 HISTORY — DX: Acute embolism and thrombosis of unspecified deep veins of unspecified lower extremity: I82.409

## 2020-08-04 NOTE — Discharge Instructions (Addendum)
Follow up with the vascular doctors this week.  Their office should contact you tomorrow.  If you don't hear from them by 3pm, call the number above.  Tell them you were seen in the ER, and Dr. Carlis Abbott wanted you seen by the end of the week.
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
## 2020-08-04 NOTE — ED Triage Notes (Signed)
Pt seen in this ED yesterday and dx with DVT in LLE. Pt states edema is worse today. He is here to make sure that is okay.

## 2020-08-09 ENCOUNTER — Other Ambulatory Visit: Payer: Self-pay

## 2020-08-09 ENCOUNTER — Other Ambulatory Visit (HOSPITAL_COMMUNITY): Payer: Self-pay | Admitting: Vascular Surgery

## 2020-08-09 ENCOUNTER — Encounter: Payer: Self-pay | Admitting: Vascular Surgery

## 2020-08-09 ENCOUNTER — Ambulatory Visit (INDEPENDENT_AMBULATORY_CARE_PROVIDER_SITE_OTHER): Payer: Commercial Managed Care - PPO | Admitting: Vascular Surgery

## 2020-08-09 ENCOUNTER — Telehealth: Payer: Self-pay

## 2020-08-09 ENCOUNTER — Ambulatory Visit (HOSPITAL_COMMUNITY)
Admission: RE | Admit: 2020-08-09 | Discharge: 2020-08-09 | Disposition: A | Discharge status: Home / Self Care | Payer: Commercial Managed Care - PPO | Source: Ambulatory Visit | Attending: Vascular Surgery | Admitting: Vascular Surgery

## 2020-08-09 VITALS — BP 136/78 | HR 54 | Temp 98.0°F | Resp 20 | Ht 76.0 in | Wt 238.0 lb

## 2020-08-09 DIAGNOSIS — Z5320 Procedure and treatment not carried out because of patient's decision for unspecified reasons: Secondary | ICD-10-CM

## 2020-08-09 DIAGNOSIS — I82422 Acute embolism and thrombosis of left iliac vein: Secondary | ICD-10-CM | POA: Diagnosis not present

## 2020-08-09 DIAGNOSIS — I8290 Acute embolism and thrombosis of unspecified vein: Secondary | ICD-10-CM

## 2020-08-09 NOTE — Telephone Encounter (Signed)
Pt updated on arrival time of 0800 am for procedure on 08/14/20 at [facility]. Verbalized understanding.
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
## 2020-08-12 ENCOUNTER — Other Ambulatory Visit: Payer: Self-pay

## 2020-08-12 ENCOUNTER — Other Ambulatory Visit (HOSPITAL_COMMUNITY)
Admission: RE | Admit: 2020-08-12 | Discharge: 2020-08-12 | Disposition: A | Discharge status: Home / Self Care | Payer: Commercial Managed Care - PPO | Source: Ambulatory Visit | Attending: Vascular Surgery | Admitting: Vascular Surgery

## 2020-08-12 DIAGNOSIS — Z20822 Contact with and (suspected) exposure to covid-19: Secondary | ICD-10-CM | POA: Diagnosis present

## 2020-08-12 LAB — SARS CORONAVIRUS 2 (TAT 6-24 HRS): SARS Coronavirus 2: NEGATIVE

## 2020-08-14 ENCOUNTER — Ambulatory Visit (HOSPITAL_COMMUNITY)
Admission: RE | Admit: 2020-08-14 | Discharge: 2020-08-14 | Disposition: A | Discharge status: Home / Self Care | Payer: Commercial Managed Care - PPO | Source: Ambulatory Visit | Attending: Vascular Surgery | Admitting: Vascular Surgery

## 2020-08-14 ENCOUNTER — Encounter (HOSPITAL_COMMUNITY): Admission: RE | Payer: Self-pay | Source: Ambulatory Visit

## 2020-08-14 ENCOUNTER — Other Ambulatory Visit: Payer: Self-pay

## 2020-08-14 ENCOUNTER — Ambulatory Visit (HOSPITAL_COMMUNITY)
Admission: RE | Disposition: A | Discharge status: Home / Self Care | Payer: Self-pay | Source: Ambulatory Visit | Attending: Vascular Surgery

## 2020-08-14 ENCOUNTER — Inpatient Hospital Stay (HOSPITAL_COMMUNITY): Admission: RE | Admit: 2020-08-14 | Payer: Commercial Managed Care - PPO | Source: Ambulatory Visit | Admitting: Surgery

## 2020-08-14 DIAGNOSIS — Z7901 Long term (current) use of anticoagulants: Secondary | ICD-10-CM | POA: Insufficient documentation

## 2020-08-14 DIAGNOSIS — Z885 Allergy status to narcotic agent status: Secondary | ICD-10-CM | POA: Diagnosis not present

## 2020-08-14 DIAGNOSIS — Z20822 Contact with and (suspected) exposure to covid-19: Secondary | ICD-10-CM | POA: Insufficient documentation

## 2020-08-14 DIAGNOSIS — I82402 Acute embolism and thrombosis of unspecified deep veins of left lower extremity: Secondary | ICD-10-CM | POA: Insufficient documentation

## 2020-08-14 DIAGNOSIS — I82492 Acute embolism and thrombosis of other specified deep vein of left lower extremity: Secondary | ICD-10-CM

## 2020-08-14 HISTORY — PX: LOWER EXTREMITY VENOGRAPHY: CATH118253

## 2020-08-14 HISTORY — PX: CORONARY ULTRASOUND/IVUS: CATH118244

## 2020-08-14 LAB — POCT I-STAT, CHEM 8
BUN: 21 mg/dL (ref 8–23)
Calcium, Ion: 1.19 mmol/L (ref 1.15–1.40)
Chloride: 99 mmol/L (ref 98–111)
Creatinine, Ser: 1.1 mg/dL (ref 0.61–1.24)
Glucose, Bld: 94 mg/dL (ref 70–99)
HCT: 46 % (ref 39.0–52.0)
Hemoglobin: 15.6 g/dL (ref 13.0–17.0)
Potassium: 5.2 mmol/L — ABNORMAL HIGH (ref 3.5–5.1)
Sodium: 139 mmol/L (ref 135–145)
TCO2: 31 mmol/L (ref 22–32)

## 2020-08-14 SURGERY — LOWER EXTREMITY VENOGRAPHY
Anesthesia: LOCAL

## 2020-08-14 SURGERY — THROMBECTOMY OF GREAT VESSEL
Anesthesia: General | Site: Leg Lower | Laterality: Left

## 2020-08-14 MED ORDER — MIDAZOLAM HCL 2 MG/2ML IJ SOLN
INTRAMUSCULAR | Status: AC
  Filled 2020-08-14: qty 2

## 2020-08-14 MED ORDER — ACETAMINOPHEN 325 MG PO TABS: 650.0000 mg | ORAL_TABLET | ORAL | Status: DC | PRN

## 2020-08-14 MED ORDER — SODIUM CHLORIDE 0.9% FLUSH: 3.0000 mL | INTRAVENOUS | Status: DC | PRN

## 2020-08-14 MED ORDER — SODIUM CHLORIDE 0.9 % IV SOLN: 250.0000 mL | INTRAVENOUS | Status: DC | PRN

## 2020-08-14 MED ORDER — HEPARIN (PORCINE) IN NACL 1000-0.9 UT/500ML-% IV SOLN
INTRAVENOUS | Status: DC | PRN
  Administered 2020-08-14 (×2): 500 mL

## 2020-08-14 MED ORDER — MIDAZOLAM HCL 2 MG/2ML IJ SOLN
INTRAMUSCULAR | Status: DC | PRN
  Administered 2020-08-14: 1 mg via INTRAVENOUS
  Administered 2020-08-14: 2 mg via INTRAVENOUS

## 2020-08-14 MED ORDER — SODIUM CHLORIDE 0.9 % WEIGHT BASED INFUSION: 1.0000 mL/kg/h | INTRAVENOUS | Status: DC

## 2020-08-14 MED ORDER — LIDOCAINE HCL (PF) 1 % IJ SOLN
INTRAMUSCULAR | Status: AC
  Filled 2020-08-14: qty 30

## 2020-08-14 MED ORDER — LIDOCAINE HCL (PF) 1 % IJ SOLN
INTRAMUSCULAR | Status: DC | PRN
  Administered 2020-08-14 (×2): 15 mL via INTRADERMAL

## 2020-08-14 MED ORDER — FENTANYL CITRATE (PF) 100 MCG/2ML IJ SOLN
INTRAMUSCULAR | Status: DC | PRN
  Administered 2020-08-14: 50 ug via INTRAVENOUS
  Administered 2020-08-14: 25 ug via INTRAVENOUS

## 2020-08-14 MED ORDER — LABETALOL HCL 5 MG/ML IV SOLN: 10.0000 mg | INTRAVENOUS | Status: DC | PRN

## 2020-08-14 MED ORDER — ONDANSETRON HCL 4 MG/2ML IJ SOLN: 4.0000 mg | Freq: Four times a day (QID) | INTRAMUSCULAR | Status: DC | PRN

## 2020-08-14 MED ORDER — SODIUM CHLORIDE 0.9% FLUSH: 3.0000 mL | Freq: Two times a day (BID) | INTRAVENOUS | Status: DC

## 2020-08-14 MED ORDER — HEPARIN (PORCINE) IN NACL 1000-0.9 UT/500ML-% IV SOLN
INTRAVENOUS | Status: AC
  Filled 2020-08-14: qty 500

## 2020-08-14 MED ORDER — IODIXANOL 320 MG/ML IV SOLN
INTRAVENOUS | Status: DC | PRN
  Administered 2020-08-14: 45 mL via INTRAVENOUS

## 2020-08-14 MED ORDER — HYDRALAZINE HCL 20 MG/ML IJ SOLN: 5.0000 mg | INTRAMUSCULAR | Status: DC | PRN

## 2020-08-14 MED ORDER — SODIUM CHLORIDE 0.9 % IV SOLN: INTRAVENOUS | Status: DC

## 2020-08-14 MED ORDER — FENTANYL CITRATE (PF) 100 MCG/2ML IJ SOLN
INTRAMUSCULAR | Status: AC
  Filled 2020-08-14: qty 2

## 2020-08-14 SURGICAL SUPPLY — 17 items
CATH ANGIO 5F BER2 100CM (CATHETERS) ×3 IMPLANT
CATH ANGIO 5F BER2 65CM (CATHETERS) IMPLANT
CATH NAVICROSS ANGLED 135CM (MICROCATHETER) ×3 IMPLANT
CATH VISIONS PV .035 IVUS (CATHETERS) ×3 IMPLANT
DEVICE TORQUE .025-.038 (MISCELLANEOUS) ×3 IMPLANT
GLIDEWIRE ADV .035X260CM (WIRE) ×3 IMPLANT
GUIDEWIRE ANGLED .035X260CM (WIRE) ×3 IMPLANT
KIT MICROPUNCTURE NIT STIFF (SHEATH) ×3 IMPLANT
PROTECTION STATION PRESSURIZED (MISCELLANEOUS) ×3
SHEATH PINNACLE 5F 10CM (SHEATH) ×3 IMPLANT
SHEATH PINNACLE 8F 10CM (SHEATH) ×3 IMPLANT
SHEATH PROBE COVER 6X72 (BAG) ×3 IMPLANT
STATION PROTECTION PRESSURIZED (MISCELLANEOUS) ×2 IMPLANT
TRAY PV CATH (CUSTOM PROCEDURE TRAY) ×3 IMPLANT
WIRE BENTSON .035X145CM (WIRE) ×3 IMPLANT
WIRE ROSEN-J .035X180CM (WIRE) ×3 IMPLANT
WIRE TORQFLEX AUST .018X40CM (WIRE) ×3 IMPLANT

## 2020-08-14 NOTE — Discharge Instructions (Signed)
Venogram A venogram, or venography, is a procedure that uses an X-ray and dye (contrast) to examine how well the veins work and how blood flows through them. Contrast helps the veins show up on X-rays. A venogram may be done:  To evaluate abnormalities in the vein.  To identify clots within veins, such as deep vein thrombosis (DVT).  To map out the veins that might be needed for another procedure. Tell a health care provider about:  Any allergies you have, especially to medicines, shellfish, iodine, and contrast.  All medicines you are taking, including vitamins, herbs, eye drops, creams, and over-the-counter medicines.  Any problems you or family members have had with anesthetic medicines.  Any blood disorders you have.  Any surgeries you have had and any complications that occurred.  Any medical conditions you have.  Whether you are pregnant, may be pregnant, or are breastfeeding.  Any history of smoking or tobacco use. What are the risks? Generally, this is a safe procedure. However, problems may occur, including:  Infection.  Bleeding.  Blood clots.  Allergic reaction to medicines or contrast.  Damage to other structures or organs.  Kidney problems.  Increased risk of cancer. Being exposed to too much radiation over a lifetime can increase the risk of cancer. The risk is small. What happens before the procedure? Medicines Ask your health care provider about:  Changing or stopping your regular medicines. This is especially important if you are taking diabetes medicines or blood thinners.  Taking medicines such as aspirin and ibuprofen. These medicines can thin your blood. Do not take these medicines unless your health care provider tells you to take them.  Taking over-the-counter medicines, vitamins, herbs, and supplements. General instructions  Follow instructions from your health care provider about eating or drinking restrictions.  You may have blood tests  to check how well your kidneys and liver are working and how well your blood can clot.  Plan to have someone take you home from the hospital or clinic. What happens during the procedure?   An IV will be inserted into one of your veins.  You may be given a medicine to help you relax (sedative).  You will lie down on an X-ray table. The table may be tilted in different directions during the procedure to help the contrast move throughout your body. Safety straps will keep you secure if the table is tilted.  If veins in your arm or leg will be examined, a band may be wrapped around that arm or leg to keep the veins full of blood. This may cause your arm or leg to feel numb.  The contrast will be injected into your IV. You may have a hot, flushed feeling as it moves throughout your body. You may also have a metallic taste in your mouth. Both of these sensations will go away after the test is complete.  You may be asked to lie in different positions or place your legs or arms in different positions.  At the end of the procedure, you may be given IV fluids to help wash or flush the contrast out of your veins.  The IV will be removed, and pressure will be applied to the IV site to prevent bleeding. A bandage (dressing) may be applied to the IV site. The exact procedure may vary among health care providers and hospitals. What can I expect after the procedure?  Your blood pressure, heart rate, breathing rate, and blood oxygen level will be monitored until you   leave the hospital or clinic.  You may be given something to eat and drink.  You may have bruising or mild discomfort in the area where the IV was inserted. Follow these instructions at home: Eating and drinking   Follow instructions from your health care provider about eating or drinking restrictions.  Drink a lot of water for the first several days after the procedure, as directed by your health care provider. This helps to flush the  contrast out of your body. Activity  Rest as told by your health care provider.  Return to your normal activities as told by your health care provider. Ask your health care provider what activities are safe for you.  If you were given a sedative during your procedure, do not drive for 24 hours or until your health care provider approves. General instructions  Check your IV insertion area every day for signs of infection. Check for: ? Redness, swelling, or pain. ? Fluid or blood. ? Warmth. ? Pus or a bad smell.  Take over-the-counter and prescription medicines only as told by your health care provider.  Keep all follow-up visits as told by your health care provider. This is important. Contact a health care provider if:  Your skin becomes itchy or you develop a rash or hives.  You have a fever that does not get better with medicine.  You feel nauseous or you vomit.  You have redness, swelling, or pain around the insertion site.  You have fluid or blood coming from the insertion site.  Your insertion area feels warm to the touch.  You have pus or a bad smell coming from the insertion site. Get help right away if you:  Have shortness of breath or difficulty breathing.  Develop chest pain.  Faint.  Feel very dizzy. These symptoms may represent a serious problem that is an emergency. Do not wait to see if the symptoms will go away. Get medical help right away. Call your local emergency services (911 in the [place] Do not drive yourself to the hospital. Summary  A venogram, or venography, is a procedure that uses an X-ray and contrast dye to check how well the veins work and how blood flows through them.  An IV will be inserted into one of your veins in order to inject the contrast.  During the exam, you will lie on an X-ray table. The table may be tilted in different directions during the procedure to help the contrast move throughout your body. Safety straps will keep you  secure.  After the procedure, you will need to drink a lot of water to help wash or flush the contrast out of your body. This information is not intended to replace advice given to you by your health care provider. Make sure you discuss any questions you have with your health care provider. Document Revised: 05/13/2019 Document Reviewed: 05/13/2019 Elsevier Patient Education  2020 [employer]  

## 2020-08-14 NOTE — Op Note (Signed)
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  Ultrasound-guided access of left popliteal vein 2.  Left iliac venogram 3.  IVUS of left popliteal, superficial femoral, common femoral, external iliac, common iliac vein and IVC 4.  Ultrasound-guided access of right popliteal vein 5.  Right iliac venogram 6.  89 minutes of monitored moderate conscious sedation time  Indications: 64 year old male who was seen in consultation by Dr. Donzetta Matters with extensive left leg DVT and concern for underlying May Thurner.  He presents today for planned left iliac thrombectomy and possible stenting.  Risk and benefits were discussed.  Findings:   Left popliteal was accessed with ultrasound guidance and ultimately I could get a wire up to the left common iliac vein but I could not cross a left common iliac vein occlusion.  There were multiple collaterals here including at the distal IVC confirmed on IVUS suggesting chronic occlusion.  Ultimately all the clot in the left iliofemoral system was subacute to chronic appearing.  I then accessed the right popliteal vein and to get my catheter up to the femoral head and the right external and common iliac vein also appeared occluded with 2 large collaterals filling the IVC distally.  Ultimately at this point in time I do not feel there was any other options for intervention in the Cath Lab today.  That point in time wires and catheters were removed.  He will be set up for CT venogram to further evaluate his anatomy.   Procedure:  The patient was identified in the holding area and taken to room 8.  Patient was placed prone on the table.  Bilateral popliteal fossa's were prepped and draped.  Timeout was performed.  Initially used ultrasound to evaluate the left popliteal  vein, it was patent, and image was saved.  This was accessed with a micro access needle placed a microwire and then a micro sheath.  I then advanced a Glidewire advantage up to the common iliac vein on the left where I then met resistance.  I upsized to an 8 [place] sheath.  I then used a number of guide catheters including a BER 2 and a glide cath.  I made multiple attempts to cross the left iliac occlusion from popliteal access.  There were multiple collaterals here and ultimately I could not find a lumen after multiple attempts.  We performed IVUS as well as left iliac venogram to try and further delineate the anatomy.  There appeared to be very subacute to chronic clot throughout the left iliac venous system and there were large nest of collaterals at the distal IVC proximal common iliac vein confirmed on IVUS.  After fairly extensive attempt to cross from popliteal access I then elected to access the right popliteal vein.  This was evaluated with ultrasound it was patent and image was saved.  I then placed a micro access needle and then a microwire and sheath and then a 5 [place] sheath.  I then used my BER 2 catheter with a initially a Rosen and then ultimately a Glidewire to then come from right popliteal access up to the IVC to try and come retrograde from the contralateral side to cross the lesion in the left iliac vein.  Again I met resistance at the femoral head.  I did a venogram and there were 2 large collaterals  here and suggesting his right iliac venous system was also occluded as well.  That point in time I elected to stop and will get a CT venogram and will have him follow-up in the office.  Plan: All the clot in the left iliofemoral system appeared subacute to chronic.  Unable to cross left iliac vein from popliteal approach and appears to have multiple collaterals at the IVC bifurcation with likely chronic appearing occlusion or may Thurner.  Subsequently accessed the right popliteal and it  appears his right common and external iliac vein are also occluded with large collaterals in the pelvis ultimately filling the IVC distally.  Not amendable to intervention today and will need CT venogram and follow-up in the office.  Other option would be IJ access from the neck in the OR and snare and the wire from below pending CT venogram findings.  Marty Heck, MD [facility] Office: [phone]

## 2020-08-14 NOTE — Progress Notes (Signed)
Discharge instructions reviewed with patient and wife Rise Paganini. Verbalized understanding.
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
## 2020-08-15 ENCOUNTER — Telehealth: Payer: Self-pay

## 2020-08-15 ENCOUNTER — Encounter (HOSPITAL_COMMUNITY): Payer: Self-pay | Admitting: Vascular Surgery

## 2020-08-15 MED FILL — Lidocaine HCl Local Preservative Free (PF) Inj 1%: INTRAMUSCULAR | Qty: 30 | Status: AC

## 2020-08-15 NOTE — Telephone Encounter (Signed)
Patient's wife called to report patient has rash over chest and underneath arms. Denies itching, swelling, pain, sob. Explained it could be any number of things causing the rash, but to monitor for spreading, and any breathing difficulties. Advised to take 25 mg of benadryl if it began to itch and call emergency services if any airway difficulties developed. Patients wife verbalized understanding.

## 2020-08-15 NOTE — Telephone Encounter (Signed)
Patient's wife called back to report some spreading of the rash but nothing else had changed. Advised her that a number of things could be causing a reaction and if she was concerned to contact PCP and EMS if breathing issues developed. She verbalized understanding.

## 2020-08-16 ENCOUNTER — Telehealth: Payer: Self-pay

## 2020-08-16 NOTE — Telephone Encounter (Signed)
Patient's wife called stating patient's rash has gotten worst. She states the rash started on his chest and has now wrapped around to his back and also saw the rash on his left leg. She reports he did take 25mg  of Benadryl last night and this morning and she doesn't see any improvement. She states she doesn't feel like she should contact PCP because one of our providers did his procedure. I spoke with PA Corrie to get recommendations on what the patient should do next. She recommend that the patient take 50mg  of Benadryl every 6 hours, if the rash doesn't improve or gets worst over the weekend they should reach out to our office on Monday for an appt. I also advised if he becomes short of breath or unable to breath should should proceed to ER or call 911. She voiced her understanding.

## 2020-08-19 ENCOUNTER — Encounter: Payer: Self-pay | Admitting: *Deleted

## 2020-08-19 ENCOUNTER — Telehealth: Payer: Self-pay | Admitting: *Deleted

## 2020-08-19 ENCOUNTER — Other Ambulatory Visit: Payer: Self-pay

## 2020-08-19 DIAGNOSIS — I82422 Acute embolism and thrombosis of left iliac vein: Secondary | ICD-10-CM

## 2020-08-19 NOTE — Telephone Encounter (Signed)
Pts wife called concerned about a rash that has now spread to cover pts entire torso, both arms and right leg. It is mildly itchy. Pt has been taking benedril 50mg  q6 since Friday and cortisone cream as needed. There has not been significant rash improvement. Advised pt to contact their pcp about this issue. Also advised pt to elevate his legs above his heart for most effective reduction in left leg swelling. Pts wife verbalized understanding.

## 2020-08-20 ENCOUNTER — Telehealth: Payer: Self-pay

## 2020-08-20 NOTE — Telephone Encounter (Signed)
Phone call to patient to review instructions for 13 hr prep for CT w/ contrast on 08/22/20 at [facility] with patients wife, who is patients primary CG. Prescription called into [facility]. Pt/CG aware and verbalized understanding of instructions. Prescription: 08/21/20 8pm- 50mg  Prednisone 08/22/20 2am- 50mg  Prednisone 11/4 8am - 50mg  Prednisone and 50mg  Benadryl

## 2020-08-20 NOTE — Telephone Encounter (Signed)
Phone call to patient to review instructions for 13 hr prep for joint injection with contrast on 08/27/20 at 11am. Prescription called into [facility]. Pt aware and verbalized understanding of instructions. Prescription: 08/26/20 10pm- 50mg  Prednisone 08/27/20 4am- 50mg  Prednisone 08/27/20 10am - 50mg  Prednisone and 50mg  Benadryl

## 2020-08-22 ENCOUNTER — Other Ambulatory Visit: Payer: Self-pay

## 2020-08-22 ENCOUNTER — Ambulatory Visit
Admission: RE | Admit: 2020-08-22 | Discharge: 2020-08-22 | Disposition: A | Discharge status: Home / Self Care | Payer: Commercial Managed Care - PPO | Source: Ambulatory Visit | Attending: Vascular Surgery | Admitting: Vascular Surgery

## 2020-08-22 DIAGNOSIS — I82422 Acute embolism and thrombosis of left iliac vein: Secondary | ICD-10-CM

## 2020-08-22 MED ORDER — IOPAMIDOL (ISOVUE-370) INJECTION 76%
125.0000 mL | Freq: Once | INTRAVENOUS | Status: AC | PRN
  Administered 2020-08-22: 125 mL via INTRAVENOUS

## 2020-08-26 ENCOUNTER — Other Ambulatory Visit: Payer: Self-pay

## 2020-08-26 MED ORDER — RIVAROXABAN 20 MG PO TABS
20.0000 mg | ORAL_TABLET | Freq: Every day | ORAL | 6 refills | Status: DC
Start: 2020-08-26 — End: 2020-09-16

## 2020-08-27 ENCOUNTER — Telehealth: Payer: Self-pay

## 2020-08-27 NOTE — Telephone Encounter (Signed)
Patient's wife called to ask about patient's nausea. She has been trying ginger ale and crackers. She wanted to know if Immodium was safe with Xarelto. According to a drug interaction website, there are no known drug interactions between the two. Told her that and that she needed to follow up with his PCP if the nausea didn't resolve. She verbalized understanding.

## 2020-08-30 ENCOUNTER — Ambulatory Visit (INDEPENDENT_AMBULATORY_CARE_PROVIDER_SITE_OTHER): Payer: Commercial Managed Care - PPO | Admitting: Vascular Surgery

## 2020-08-30 ENCOUNTER — Encounter: Payer: Self-pay | Admitting: Vascular Surgery

## 2020-08-30 ENCOUNTER — Other Ambulatory Visit: Payer: Self-pay

## 2020-08-30 VITALS — BP 165/82 | HR 51 | Temp 97.6°F | Resp 20 | Ht 76.0 in | Wt 229.0 lb

## 2020-08-30 DIAGNOSIS — I871 Compression of vein: Secondary | ICD-10-CM

## 2020-08-30 MED ORDER — DIPHENHYDRAMINE HCL 50 MG/ML IJ SOLN
25.0000 mg | Freq: Once | INTRAMUSCULAR | Status: DC
Start: 2020-09-05 — End: 2021-04-04

## 2020-08-30 MED ORDER — SODIUM CHLORIDE 0.9 % IV SOLN: 60.0000 mg | Freq: Once | INTRAVENOUS | Status: DC

## 2020-08-30 NOTE — Progress Notes (Signed)
Patient ID: Don Massey, male   DOB: [birth year], 64 y.o.   MRN: [id number]  Reason for Consult: Follow-up   Referred by Gaynelle Arabian, MD  Subjective:     HPI:  Don Massey is a 64 y.o. male whom I had initially seen for thrombophlebitis in his bilateral lower extremities.  He took a short course of anticoagulation at that time.  Subsequently he had extensive left lower extremity DVT and swelling he was set up for venography from the left popliteal approach.  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  Also had what appeared to be chronic or subacute disease on the right.  He was subsequently set up with CT venogram to see me today.  States that his swelling has resolved.  He remains on Xarelto.  He is really not been doing any activity out of fear he does wear compression stockings now that he can get them on.  Past Medical History:  Diagnosis Date  . Cataracts, bilateral   . DVT (deep venous thrombosis) ([place])   . GERD (gastroesophageal reflux disease)   . Hyperlipidemia   . 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 No pertinent family history. Past Surgical History:  Procedure Laterality Date  . CATARACT EXTRACTION    . HAND SURGERY Right    Cyst removal  . INTRAVASCULAR ULTRASOUND/IVUS Bilateral 08/14/2020   Procedure: Intravascular Ultrasound/IVUS;  Surgeon: Marty Heck, MD;  Location: [facility];  Service: Cardiovascular;  Laterality: Bilateral;  lower legs  . LOWER EXTREMITY VENOGRAPHY Bilateral 08/14/2020   Procedure: LOWER EXTREMITY VENOGRAPHY;  Surgeon: Marty Heck, MD;  Location: [facility];  Service: Cardiovascular;  Laterality: Bilateral;  . skin grafts     Legs and arms  . UMBILICAL HERNIA REPAIR    . UMBILICAL HERNIA REPAIR N/A 05/21/2020   Procedure: LAPAROSCOPIC ASSISTED RECURRENT UMBILICAL HERNIA REPAIR  WITH MESH;  Surgeon: Johnathan Hausen, MD;  Location: [facility];  Service: General;  Laterality: N/A;  . WISDOM TOOTH EXTRACTION      Short Social History:  Social History   Tobacco Use  . Smoking status: Never Smoker  . Smokeless tobacco: Never Used  Substance Use Topics  . Alcohol use: Yes    Alcohol/week: 0.0 standard drinks    Comment: rare    Allergies  Allergen Reactions  . Codeine Nausea And Vomiting  . Ivp Dye [Iodinated Diagnostic Agents] Rash    Current Outpatient Medications  Medication Sig Dispense Refill  . Multiple Vitamin (MULTIVITAMIN) tablet Take 1 tablet by mouth daily.    . Omeprazole 20 MG TBEC Take 20 mg by mouth daily.   0  . Polyethyl Glycol-Propyl Glycol (SYSTANE) 0.4-0.3 % SOLN Apply 1 drop to eye daily as needed (dry eyes).    . rivaroxaban (XARELTO) 20 MG TABS tablet Take 1 tablet (20 mg total) by mouth daily with supper. 30 tablet 6  . rosuvastatin (CRESTOR) 10 MG tablet Take 10 mg by mouth every Monday, Wednesday, and Friday.      No current facility-administered medications for this visit.    Review of Systems  Constitutional:  Constitutional negative. HENT: HENT negative.  Eyes: Eyes negative.  Respiratory: Respiratory negative.  Cardiovascular: Positive for leg swelling.  GI: Gastrointestinal negative.  Musculoskeletal: Musculoskeletal negative.  Skin: Skin negative.  Neurological: Neurological negative. Hematologic: Hematologic/lymphatic negative.  Psychiatric: Psychiatric negative.  Objective:  Objective   Vitals:   08/30/20 1053  BP: (!) 165/82  Pulse: (!) 51  Resp: 20  Temp: 97.6 F (36.4 C)  SpO2: 100%  Weight: 229 lb (103.9 kg)  Height: 6\' 4"  (1.93 m)   Body mass index is 27.87 kg/m.  Physical Exam HENT:     Head: Normocephalic.     Nose:     Comments: Wearing a mask Eyes:     Pupils: Pupils are equal, round, and reactive to light.  Cardiovascular:     Pulses: Normal pulses.  Pulmonary:     Effort:  Pulmonary effort is normal.  Abdominal:     General: Abdomen is flat.     Palpations: Abdomen is soft. There is no mass.  Musculoskeletal:     Right lower leg: No edema.     Left lower leg: No edema.  Skin:    Capillary Refill: Capillary refill takes less than 2 seconds.  Neurological:     General: No focal deficit present.     Mental Status: He is alert.  Psychiatric:        Mood and Affect: Mood normal.        Behavior: Behavior normal.        Thought Content: Thought content normal.        Judgment: Judgment normal.     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 DVT involves the left common iliac vein, internal iliac veins, external iliac veins, and the left common femoral vein.  There is either a dissection flap or a chronic venous web of the IVC extending from the iliac vein confluence to below the right renal vein. This does not limit flow in the IVC.  Additional ancillary findings as above.   I reviewed his recent venogram pictures.     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  There are distended left common and external iliac veins with what appears to be thrombus that common femoral vein has thrombus as well the femoral vein appears free of thrombus.  There are no right-sided issues that can be determined on the CT.  I discussed with the patient his options being continued anticoagulation for some definitive.  Transition to aspirin after this versus repeat attempt at venography.  Patient would like to have repeat attempt this will be from a femoral approach as well as a right IJ approach with general anesthesia.  He has had issues with contrast in the past we  will give IV Benadryl and Solu-Medrol at the time of surgery.  Patient understands that there is approximately 50% chance of crossing the stenosis.  We will get him set up in the operating room in the near future.  He will not take Xarelto the morning of procedure but will take it up until that time.     [name] MD [facility]

## 2020-08-30 NOTE — H&P (View-Only) (Signed)
Patient ID: Don Massey, male   DOB: Nov 25, 1955, 63 y.o.   MRN: [id number]  Reason for Consult: Follow-up   Referred by Gaynelle Arabian, MD  Subjective:     HPI:  Don Massey is a 64 y.o. male whom I had initially seen for thrombophlebitis in his bilateral lower extremities.  He took a short course of anticoagulation at that time.  Subsequently he had extensive left lower extremity DVT and swelling he was set up for venography from the left popliteal approach.  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  Also had what appeared to be chronic or subacute disease on the right.  He was subsequently set up with CT venogram to see me today.  States that his swelling has resolved.  He remains on Xarelto.  He is really not been doing any activity out of fear he does wear compression stockings now that he can get them on.  Past Medical History:  Diagnosis Date  . Cataracts, bilateral   . DVT (deep venous thrombosis) ([place])   . GERD (gastroesophageal reflux disease)   . Hyperlipidemia   . 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 No pertinent family history. Past Surgical History:  Procedure Laterality Date  . CATARACT EXTRACTION    . HAND SURGERY Right    Cyst removal  . INTRAVASCULAR ULTRASOUND/IVUS Bilateral 08/14/2020   Procedure: Intravascular Ultrasound/IVUS;  Surgeon: Marty Heck, MD;  Location: [facility];  Service: Cardiovascular;  Laterality: Bilateral;  lower legs  . LOWER EXTREMITY VENOGRAPHY Bilateral 08/14/2020   Procedure: LOWER EXTREMITY VENOGRAPHY;  Surgeon: Marty Heck, MD;  Location: [facility];  Service: Cardiovascular;  Laterality: Bilateral;  . skin grafts     Legs and arms  . UMBILICAL HERNIA REPAIR    . UMBILICAL HERNIA REPAIR N/A 05/21/2020   Procedure: LAPAROSCOPIC ASSISTED RECURRENT UMBILICAL HERNIA REPAIR  WITH MESH;  Surgeon: Johnathan Hausen, MD;  Location: [facility];  Service: General;  Laterality: N/A;  . WISDOM TOOTH EXTRACTION      Short Social History:  Social History   Tobacco Use  . Smoking status: Never Smoker  . Smokeless tobacco: Never Used  Substance Use Topics  . Alcohol use: Yes    Alcohol/week: 0.0 standard drinks    Comment: rare    Allergies  Allergen Reactions  . Codeine Nausea And Vomiting  . Ivp Dye [Iodinated Diagnostic Agents] Rash    Current Outpatient Medications  Medication Sig Dispense Refill  . Multiple Vitamin (MULTIVITAMIN) tablet Take 1 tablet by mouth daily.    . Omeprazole 20 MG TBEC Take 20 mg by mouth daily.   0  . Polyethyl Glycol-Propyl Glycol (SYSTANE) 0.4-0.3 % SOLN Apply 1 drop to eye daily as needed (dry eyes).    . rivaroxaban (XARELTO) 20 MG TABS tablet Take 1 tablet (20 mg total) by mouth daily with supper. 30 tablet 6  . rosuvastatin (CRESTOR) 10 MG tablet Take 10 mg by mouth every Monday, Wednesday, and Friday.      No current facility-administered medications for this visit.    Review of Systems  Constitutional:  Constitutional negative. HENT: HENT negative.  Eyes: Eyes negative.  Respiratory: Respiratory negative.  Cardiovascular: Positive for leg swelling.  GI: Gastrointestinal negative.  Musculoskeletal: Musculoskeletal negative.  Skin: Skin negative.  Neurological: Neurological negative. Hematologic: Hematologic/lymphatic negative.  Psychiatric: Psychiatric negative.  Objective:  Objective   Vitals:   08/30/20 1053  BP: (!) 165/82  Pulse: (!) 51  Resp: 20  Temp: 97.6 F (36.4 C)  SpO2: 100%  Weight: 229 lb (103.9 kg)  Height: 6\' 4"  (1.93 m)   Body mass index is 27.87 kg/m.  Physical Exam HENT:     Head: Normocephalic.     Nose:     Comments: Wearing a mask Eyes:     Pupils: Pupils are equal, round, and reactive to light.  Cardiovascular:     Pulses: Normal pulses.  Pulmonary:     Effort:  Pulmonary effort is normal.  Abdominal:     General: Abdomen is flat.     Palpations: Abdomen is soft. There is no mass.  Musculoskeletal:     Right lower leg: No edema.     Left lower leg: No edema.  Skin:    Capillary Refill: Capillary refill takes less than 2 seconds.  Neurological:     General: No focal deficit present.     Mental Status: He is alert.  Psychiatric:        Mood and Affect: Mood normal.        Behavior: Behavior normal.        Thought Content: Thought content normal.        Judgment: Judgment normal.     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 DVT involves the left common iliac vein, internal iliac veins, external iliac veins, and the left common femoral vein.  There is either a dissection flap or a chronic venous web of the IVC extending from the iliac vein confluence to below the right renal vein. This does not limit flow in the IVC.  Additional ancillary findings as above.   I reviewed his recent venogram pictures.     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  There are distended left common and external iliac veins with what appears to be thrombus that common femoral vein has thrombus as well the femoral vein appears free of thrombus.  There are no right-sided issues that can be determined on the CT.  I discussed with the patient his options being continued anticoagulation for some definitive.  Transition to aspirin after this versus repeat attempt at venography.  Patient would like to have repeat attempt this will be from a femoral approach as well as a right IJ approach with general anesthesia.  He has had issues with contrast in the past we  will give IV Benadryl and Solu-Medrol at the time of surgery.  Patient understands that there is approximately 50% chance of crossing the stenosis.  We will get him set up in the operating room in the near future.  He will not take Xarelto the morning of procedure but will take it up until that time.     [name] MD [facility]

## 2020-09-02 NOTE — Progress Notes (Signed)
[id number] - [place], [address] [place] [address] Phone: [phone] Fax: [phone]      Your procedure is scheduled on 09/05/2020.  Report to [facility] Main Entrance "A" at 07:53 A.M., and check in at the Admitting office.  Call this number if you have problems the morning of surgery:  [phone]  Call [phone] if you have any questions prior to your surgery date Monday-Friday 8am-4pm    Remember:  Do not eat or drink after midnight the night before your surgery     Take these medicines the morning of surgery with A SIP OF WATER  Omeprazole  Follow your surgeon's instructions on when to stop Xarelto.  If no instructions were given by your surgeon then you will need to call the office to get those instructions.    As of today, STOP taking an Aleve, Naproxen, Ibuprofen, Motrin, Advil, Goody's, BC's, all herbal medications, fish oil, and all vitamins.                      Do not wear jewelry.            Do not wear lotions, powders, perfumes/colognes, or deodorant.            Do not shave 48 hours prior to surgery.  Men may shave face and neck.            Do not bring valuables to the hospital.            [facility] is not responsible for any belongings or valuables.  Do NOT Smoke (Tobacco/Vaping) or drink Alcohol 24 hours prior to your procedure If you use a CPAP at night, you may bring all equipment for your overnight stay.   Contacts, glasses, dentures or bridgework may not be worn into surgery.      For patients admitted to the hospital, discharge time will be determined by your treatment team.   Patients discharged the day of surgery will not be allowed to drive home, and someone needs to stay with them for 24 hours.    Special instructions:   [place]- Preparing For Surgery  Before surgery, you can play an important role. Because skin is not sterile, your skin  needs to be as free of germs as possible. You can reduce the number of germs on your skin by washing with CHG (chlorahexidine gluconate) Soap before surgery.  CHG is an antiseptic cleaner which kills germs and bonds with the skin to continue killing germs even after washing.    Oral Hygiene is also important to reduce your risk of infection.  Remember - BRUSH YOUR TEETH THE MORNING OF SURGERY WITH YOUR REGULAR TOOTHPASTE  Please do not use if you have an allergy to CHG or antibacterial soaps. If your skin becomes reddened/irritated stop using the CHG.  Do not shave (including legs and underarms) for at least 48 hours prior to first CHG shower. It is OK to shave your face.  Please follow these instructions carefully.   1. Shower the NIGHT BEFORE SURGERY and the MORNING OF SURGERY with CHG Soap.   2. If you chose to wash your hair, wash your hair first as usual with your normal shampoo.  3. After you shampoo, rinse your hair and body thoroughly to remove the shampoo.  4. Use CHG as you would any other liquid soap. You can apply CHG directly to the  skin and wash gently with a scrungie or a clean washcloth.   5. Apply the CHG Soap to your body ONLY FROM THE NECK DOWN.  Do not use on open wounds or open sores. Avoid contact with your eyes, ears, mouth and genitals (private parts). Wash Face and genitals (private parts)  with your normal soap.   6. Wash thoroughly, paying special attention to the area where your surgery will be performed.  7. Thoroughly rinse your body with warm water from the neck down.  8. DO NOT shower/wash with your normal soap after using and rinsing off the CHG Soap.  9. Pat yourself dry with a CLEAN TOWEL.  10. Wear CLEAN PAJAMAS to bed the night before surgery  11. Place CLEAN SHEETS on your bed the night of your first shower and DO NOT SLEEP WITH PETS.   Day of Surgery: Wear Clean/Comfortable clothing the morning of surgery Do not apply any deodorants/lotions.    Remember to brush your teeth WITH YOUR REGULAR TOOTHPASTE.   Please read over the following fact sheets that you were given.

## 2020-09-03 ENCOUNTER — Other Ambulatory Visit: Payer: Self-pay

## 2020-09-03 ENCOUNTER — Encounter (HOSPITAL_COMMUNITY)
Admission: RE | Admit: 2020-09-03 | Discharge: 2020-09-03 | Disposition: A | Discharge status: Home / Self Care | Payer: Commercial Managed Care - PPO | Source: Ambulatory Visit | Attending: Vascular Surgery | Admitting: Vascular Surgery

## 2020-09-03 ENCOUNTER — Encounter (HOSPITAL_COMMUNITY): Payer: Self-pay

## 2020-09-03 DIAGNOSIS — Z01812 Encounter for preprocedural laboratory examination: Secondary | ICD-10-CM | POA: Diagnosis present

## 2020-09-03 HISTORY — DX: Peripheral vascular disease, unspecified: I73.9

## 2020-09-03 LAB — BASIC METABOLIC PANEL
Anion gap: 8 (ref 5–15)
BUN: 12 mg/dL (ref 8–23)
CO2: 29 mmol/L (ref 22–32)
Calcium: 9.3 mg/dL (ref 8.9–10.3)
Chloride: 100 mmol/L (ref 98–111)
Creatinine, Ser: 1.14 mg/dL (ref 0.61–1.24)
GFR, Estimated: >60 mL/min (ref >60)
Glucose, Bld: 108 mg/dL — ABNORMAL HIGH (ref 70–99)
Potassium: 4.3 mmol/L (ref 3.5–5.1)
Sodium: 137 mmol/L (ref 135–145)

## 2020-09-03 NOTE — Progress Notes (Signed)
PCP - Dr. Gaynelle Arabian Cardiologist - denies  PPM/ICD - denies   Chest x-ray - n/a EKG - 08/14/2020 Stress Test - n/a ECHO - n/a Cardiac Cath -n/a   Sleep Study -  denies  Follow your surgeon's instructions on when to stop xarelto.  If no instructions were given by your surgeon then you will need to call the office to get those instructions.      COVID TEST- 09/04/2020 1230   Anesthesia review: no  Patient denies shortness of breath, fever, cough and chest pain at PAT appointment   All instructions explained to the patient, with a verbal understanding of the material. Patient agrees to go over the instructions while at home for a better understanding. Patient also instructed to self quarantine after being tested for COVID-19. The opportunity to ask questions was provided.

## 2020-09-04 ENCOUNTER — Other Ambulatory Visit (HOSPITAL_COMMUNITY)
Admission: RE | Admit: 2020-09-04 | Discharge: 2020-09-04 | Disposition: A | Discharge status: Home / Self Care | Payer: Commercial Managed Care - PPO | Source: Ambulatory Visit | Attending: Vascular Surgery | Admitting: Vascular Surgery

## 2020-09-04 DIAGNOSIS — Z20822 Contact with and (suspected) exposure to covid-19: Secondary | ICD-10-CM | POA: Diagnosis not present

## 2020-09-04 DIAGNOSIS — Z01812 Encounter for preprocedural laboratory examination: Secondary | ICD-10-CM | POA: Insufficient documentation

## 2020-09-04 LAB — SARS CORONAVIRUS 2 (TAT 6-24 HRS): SARS Coronavirus 2: NEGATIVE

## 2020-09-05 ENCOUNTER — Ambulatory Visit (HOSPITAL_COMMUNITY)
Admission: RE | Admit: 2020-09-05 | Discharge: 2020-09-05 | Disposition: A | Discharge status: Home / Self Care | Payer: Commercial Managed Care - PPO | Attending: Vascular Surgery | Admitting: Vascular Surgery

## 2020-09-05 ENCOUNTER — Ambulatory Visit (HOSPITAL_COMMUNITY): Payer: Commercial Managed Care - PPO | Admitting: Anesthesiology

## 2020-09-05 ENCOUNTER — Ambulatory Visit (HOSPITAL_COMMUNITY): Payer: Commercial Managed Care - PPO

## 2020-09-05 ENCOUNTER — Encounter (HOSPITAL_COMMUNITY)
Admission: RE | Disposition: A | Discharge status: Home / Self Care | Payer: Self-pay | Source: Home / Self Care | Attending: Vascular Surgery

## 2020-09-05 DIAGNOSIS — Z79899 Other long term (current) drug therapy: Secondary | ICD-10-CM | POA: Diagnosis not present

## 2020-09-05 DIAGNOSIS — I82522 Chronic embolism and thrombosis of left iliac vein: Secondary | ICD-10-CM | POA: Insufficient documentation

## 2020-09-05 DIAGNOSIS — I82422 Acute embolism and thrombosis of left iliac vein: Secondary | ICD-10-CM

## 2020-09-05 DIAGNOSIS — I82492 Acute embolism and thrombosis of other specified deep vein of left lower extremity: Secondary | ICD-10-CM

## 2020-09-05 DIAGNOSIS — Z7901 Long term (current) use of anticoagulants: Secondary | ICD-10-CM | POA: Insufficient documentation

## 2020-09-05 HISTORY — PX: ENDOVASCULAR STENT INSERTION: SHX5161

## 2020-09-05 HISTORY — PX: VENOGRAM: SHX5497

## 2020-09-05 SURGERY — VENOGRAM
Anesthesia: General | Laterality: Left

## 2020-09-05 MED ORDER — CHLORHEXIDINE GLUCONATE 4 % EX LIQD: 60.0000 mL | Freq: Once | CUTANEOUS | Status: DC

## 2020-09-05 MED ORDER — CEFAZOLIN SODIUM-DEXTROSE 2-4 GM/100ML-% IV SOLN
2.0000 g | INTRAVENOUS | Status: AC
  Administered 2020-09-05: 2 g via INTRAVENOUS
  Filled 2020-09-05: qty 100

## 2020-09-05 MED ORDER — ORAL CARE MOUTH RINSE: 15.0000 mL | Freq: Once | OROMUCOSAL | Status: AC

## 2020-09-05 MED ORDER — ONDANSETRON HCL 4 MG/2ML IJ SOLN
INTRAMUSCULAR | Status: DC | PRN
  Administered 2020-09-05: 4 mg via INTRAVENOUS

## 2020-09-05 MED ORDER — HYDROCORTISONE NA SUCCINATE PF 250 MG IJ SOLR
INTRAMUSCULAR | Status: AC
  Filled 2020-09-05: qty 250

## 2020-09-05 MED ORDER — DIPHENHYDRAMINE HCL 50 MG/ML IJ SOLN
INTRAMUSCULAR | Status: AC
  Filled 2020-09-05: qty 1

## 2020-09-05 MED ORDER — EPHEDRINE SULFATE-NACL 50-0.9 MG/10ML-% IV SOSY
PREFILLED_SYRINGE | INTRAVENOUS | Status: DC | PRN
  Administered 2020-09-05 (×2): 10 mg via INTRAVENOUS

## 2020-09-05 MED ORDER — HYDROCORTISONE NA SUCCINATE PF 100 MG IJ SOLR
INTRAMUSCULAR | Status: DC | PRN
  Administered 2020-09-05: 250 mg via INTRAVENOUS

## 2020-09-05 MED ORDER — FENTANYL CITRATE (PF) 250 MCG/5ML IJ SOLN
INTRAMUSCULAR | Status: AC
  Filled 2020-09-05: qty 5

## 2020-09-05 MED ORDER — PROPOFOL 10 MG/ML IV BOLUS
INTRAVENOUS | Status: AC
  Filled 2020-09-05: qty 20

## 2020-09-05 MED ORDER — GLYCOPYRROLATE PF 0.2 MG/ML IJ SOSY
PREFILLED_SYRINGE | INTRAMUSCULAR | Status: DC | PRN
  Administered 2020-09-05: .2 mg via INTRAVENOUS

## 2020-09-05 MED ORDER — IODIXANOL 320 MG/ML IV SOLN
INTRAVENOUS | Status: DC | PRN
  Administered 2020-09-05: 40 mL via INTRAVENOUS

## 2020-09-05 MED ORDER — ONDANSETRON HCL 4 MG/2ML IJ SOLN: 4.0000 mg | Freq: Once | INTRAMUSCULAR | Status: DC | PRN

## 2020-09-05 MED ORDER — HEPARIN SODIUM (PORCINE) 1000 UNIT/ML IJ SOLN
INTRAMUSCULAR | Status: AC
  Filled 2020-09-05: qty 1

## 2020-09-05 MED ORDER — SODIUM CHLORIDE 0.9 % IV SOLN
INTRAVENOUS | Status: AC
  Filled 2020-09-05: qty 1.2

## 2020-09-05 MED ORDER — SODIUM CHLORIDE 0.9 % IV SOLN: INTRAVENOUS | Status: DC

## 2020-09-05 MED ORDER — SUGAMMADEX SODIUM 200 MG/2ML IV SOLN
INTRAVENOUS | Status: DC | PRN
  Administered 2020-09-05: 200 mg via INTRAVENOUS

## 2020-09-05 MED ORDER — GLYCOPYRROLATE PF 0.2 MG/ML IJ SOSY
PREFILLED_SYRINGE | INTRAMUSCULAR | Status: AC
  Filled 2020-09-05: qty 1

## 2020-09-05 MED ORDER — EPHEDRINE 5 MG/ML INJ
INTRAVENOUS | Status: AC
  Filled 2020-09-05: qty 10

## 2020-09-05 MED ORDER — PHENYLEPHRINE HCL-NACL 10-0.9 MG/250ML-% IV SOLN
INTRAVENOUS | Status: DC | PRN
  Administered 2020-09-05: 50 ug/min via INTRAVENOUS

## 2020-09-05 MED ORDER — LIDOCAINE 2% (20 MG/ML) 5 ML SYRINGE
INTRAMUSCULAR | Status: AC
  Filled 2020-09-05: qty 5

## 2020-09-05 MED ORDER — KETOROLAC TROMETHAMINE 30 MG/ML IJ SOLN
INTRAMUSCULAR | Status: DC | PRN
  Administered 2020-09-05: 60 mg via INTRAVENOUS

## 2020-09-05 MED ORDER — HEPARIN SODIUM (PORCINE) 1000 UNIT/ML IJ SOLN
INTRAMUSCULAR | Status: DC | PRN
  Administered 2020-09-05: 8000 [IU] via INTRAVENOUS

## 2020-09-05 MED ORDER — PHENYLEPHRINE 40 MCG/ML (10ML) SYRINGE FOR IV PUSH (FOR BLOOD PRESSURE SUPPORT)
PREFILLED_SYRINGE | INTRAVENOUS | Status: AC
  Filled 2020-09-05: qty 10

## 2020-09-05 MED ORDER — PROTAMINE SULFATE 10 MG/ML IV SOLN
INTRAVENOUS | Status: AC
  Filled 2020-09-05: qty 5

## 2020-09-05 MED ORDER — MIDAZOLAM HCL 5 MG/5ML IJ SOLN
INTRAMUSCULAR | Status: DC | PRN
  Administered 2020-09-05: 2 mg via INTRAVENOUS

## 2020-09-05 MED ORDER — KETOROLAC TROMETHAMINE 30 MG/ML IJ SOLN
INTRAMUSCULAR | Status: AC
  Filled 2020-09-05: qty 2

## 2020-09-05 MED ORDER — LACTATED RINGERS IV SOLN: INTRAVENOUS | Status: DC

## 2020-09-05 MED ORDER — LABETALOL HCL 5 MG/ML IV SOLN
INTRAVENOUS | Status: DC | PRN
  Administered 2020-09-05: 10 mg via INTRAVENOUS
  Administered 2020-09-05: 5 mg via INTRAVENOUS

## 2020-09-05 MED ORDER — MIDAZOLAM HCL 2 MG/2ML IJ SOLN
INTRAMUSCULAR | Status: AC
  Filled 2020-09-05: qty 2

## 2020-09-05 MED ORDER — ROCURONIUM BROMIDE 10 MG/ML (PF) SYRINGE
PREFILLED_SYRINGE | INTRAVENOUS | Status: AC
  Filled 2020-09-05: qty 10

## 2020-09-05 MED ORDER — PROPOFOL 10 MG/ML IV BOLUS
INTRAVENOUS | Status: DC | PRN
  Administered 2020-09-05: 170 mg via INTRAVENOUS

## 2020-09-05 MED ORDER — CHLORHEXIDINE GLUCONATE 0.12 % MT SOLN
15.0000 mL | Freq: Once | OROMUCOSAL | Status: AC
  Administered 2020-09-05: 15 mL via OROMUCOSAL
  Filled 2020-09-05: qty 15

## 2020-09-05 MED ORDER — FENTANYL CITRATE (PF) 250 MCG/5ML IJ SOLN
INTRAMUSCULAR | Status: DC | PRN
  Administered 2020-09-05: 100 ug via INTRAVENOUS

## 2020-09-05 MED ORDER — SODIUM CHLORIDE 0.9 % IV SOLN
INTRAVENOUS | Status: DC | PRN
  Administered 2020-09-05: 500 mL

## 2020-09-05 MED ORDER — DIPHENHYDRAMINE HCL 50 MG/ML IJ SOLN
INTRAMUSCULAR | Status: DC | PRN
  Administered 2020-09-05: 50 mg via INTRAVENOUS

## 2020-09-05 MED ORDER — ONDANSETRON HCL 4 MG/2ML IJ SOLN
INTRAMUSCULAR | Status: AC
  Filled 2020-09-05: qty 2

## 2020-09-05 MED ORDER — FENTANYL CITRATE (PF) 100 MCG/2ML IJ SOLN: 25.0000 ug | INTRAMUSCULAR | Status: DC | PRN

## 2020-09-05 MED ORDER — LACTATED RINGERS IV SOLN: INTRAVENOUS | Status: DC | PRN

## 2020-09-05 SURGICAL SUPPLY — 83 items
BAG SNAP BAND KOVER 36X36 (MISCELLANEOUS) ×3 IMPLANT
BALLN ATLAS 16X40X75 (BALLOONS) ×3
BALLOON ATLAS 16X40X75 (BALLOONS) ×2 IMPLANT
BLADE SURG 11 STRL SS (BLADE) ×3 IMPLANT
BNDG ELASTIC 4X5.8 VLCR STR LF (GAUZE/BANDAGES/DRESSINGS) IMPLANT
CANISTER SUCT 3000ML PPV (MISCELLANEOUS) ×3 IMPLANT
CATH ANGIO 5F BER2 65CM (CATHETERS) ×3 IMPLANT
CATH BEACON 5 .035 65 KMP TIP (CATHETERS) ×3 IMPLANT
CATH OMNI FLUSH .035X70CM (CATHETERS) IMPLANT
CATH OMNI FLUSH 5F 65CM (CATHETERS) ×3 IMPLANT
CATH QUICKCROSS SUPP .035X90CM (MICROCATHETER) ×3 IMPLANT
CATH VISIONS PV .035 IVUS (CATHETERS) ×3 IMPLANT
CLIP VESOCCLUDE MED 6/CT (CLIP) IMPLANT
CLIP VESOCCLUDE SM WIDE 6/CT (CLIP) IMPLANT
COVER DOME SNAP 22 D (MISCELLANEOUS) ×3 IMPLANT
COVER PROBE W GEL 5X96 (DRAPES) ×3 IMPLANT
COVER SURGICAL LIGHT HANDLE (MISCELLANEOUS) ×3 IMPLANT
COVER WAND RF STERILE (DRAPES) ×3 IMPLANT
DERMABOND ADVANCED (GAUZE/BANDAGES/DRESSINGS) ×2
DERMABOND ADVANCED .7 DNX12 (GAUZE/BANDAGES/DRESSINGS) ×4 IMPLANT
DEVICE TORQUE KENDALL .025-038 (MISCELLANEOUS) ×3 IMPLANT
DRAIN CHANNEL 15F RND FF W/TCR (WOUND CARE) IMPLANT
DRAPE BRACHIAL (DRAPES) ×3 IMPLANT
DRAPE FEMORAL ANGIO 80X135IN (DRAPES) ×3 IMPLANT
DRAPE X-RAY CASS 24X20 (DRAPES) IMPLANT
ELECT REM PT RETURN 9FT ADLT (ELECTROSURGICAL)
ELECTRODE REM PT RTRN 9FT ADLT (ELECTROSURGICAL) IMPLANT
EVACUATOR SILICONE 100CC (DRAIN) IMPLANT
GLIDEWIRE ADV .035X260CM (WIRE) ×3 IMPLANT
GLOVE BIO SURGEON STRL SZ7.5 (GLOVE) ×3 IMPLANT
GOWN STRL REUS W/ TWL LRG LVL3 (GOWN DISPOSABLE) ×6 IMPLANT
GOWN STRL REUS W/ TWL XL LVL3 (GOWN DISPOSABLE) ×2 IMPLANT
GOWN STRL REUS W/TWL LRG LVL3 (GOWN DISPOSABLE) ×9
GOWN STRL REUS W/TWL XL LVL3 (GOWN DISPOSABLE) ×3
GUIDEWIRE ANGLED .035X150CM (WIRE) ×3 IMPLANT
HEMOSTAT SNOW SURGICEL 2X4 (HEMOSTASIS) IMPLANT
KIT BASIN OR (CUSTOM PROCEDURE TRAY) ×3 IMPLANT
KIT ENCORE 26 ADVANTAGE (KITS) ×3 IMPLANT
KIT TURNOVER KIT B (KITS) ×3 IMPLANT
NEEDLE PERC 18GX7CM (NEEDLE) ×3 IMPLANT
NS IRRIG 1000ML POUR BTL (IV SOLUTION) IMPLANT
PACK CV ACCESS (CUSTOM PROCEDURE TRAY) IMPLANT
PACK GENERAL/GYN (CUSTOM PROCEDURE TRAY) ×3 IMPLANT
PACK PERIPHERAL VASCULAR (CUSTOM PROCEDURE TRAY) IMPLANT
PAD ARMBOARD 7.5X6 YLW CONV (MISCELLANEOUS) ×6 IMPLANT
PADDING CAST ABS 6INX4YD NS (CAST SUPPLIES)
PADDING CAST ABS COTTON 6X4 NS (CAST SUPPLIES) IMPLANT
SET COLLECT BLD 21X3/4 12 (NEEDLE) IMPLANT
SET MICROPUNCTURE 5F STIFF (MISCELLANEOUS) ×3 IMPLANT
SHEATH AVANTI 11CM 5FR (SHEATH) ×3 IMPLANT
SHEATH FAST CATH 10F 12CM (SHEATH) ×3 IMPLANT
SHEATH HIGHFLEX ANSEL 7FR 55CM (SHEATH) ×3 IMPLANT
SHEATH PINNACLE 5F 10CM (SHEATH) ×3 IMPLANT
SHEATH PINNACLE 6F 10CM (SHEATH) ×3 IMPLANT
SHEATH PINNACLE 8F 10CM (SHEATH) ×3 IMPLANT
SLEEVE ISOL F/PACE RF HD COVER (MISCELLANEOUS) ×3 IMPLANT
SNARE GOOSENECK 15MM (MISCELLANEOUS) ×3 IMPLANT
STENT WALLSTENTÂ 16X90X75 (Permanent Stent) ×3 IMPLANT
STOPCOCK 4 WAY LG BORE MALE ST (IV SETS) IMPLANT
STOPCOCK MORSE 400PSI 3WAY (MISCELLANEOUS) ×3 IMPLANT
SUT ETHILON 3 0 PS 1 (SUTURE) IMPLANT
SUT MNCRL AB 4-0 PS2 18 (SUTURE) IMPLANT
SUT PROLENE 5 0 C 1 24 (SUTURE) IMPLANT
SUT PROLENE 6 0 BV (SUTURE) IMPLANT
SUT PROLENE 7 0 BV 1 (SUTURE) IMPLANT
SUT SILK 2 0 SH (SUTURE) IMPLANT
SUT VIC AB 2-0 CT1 27 (SUTURE)
SUT VIC AB 2-0 CT1 TAPERPNT 27 (SUTURE) IMPLANT
SUT VIC AB 3-0 SH 27 (SUTURE)
SUT VIC AB 3-0 SH 27X BRD (SUTURE) IMPLANT
SYR 10ML LL (SYRINGE) ×3 IMPLANT
SYR 20ML LL LF (SYRINGE) ×3 IMPLANT
SYR 30ML LL (SYRINGE) ×3 IMPLANT
SYR MEDRAD MARK V 150ML (SYRINGE) IMPLANT
TAPE STRIPS DRAPE STRL (GAUZE/BANDAGES/DRESSINGS) ×3 IMPLANT
TOWEL GREEN STERILE (TOWEL DISPOSABLE) ×3 IMPLANT
TRAY FOLEY MTR SLVR 16FR STAT (SET/KITS/TRAYS/PACK) IMPLANT
TUBING EXTENTION W/L.L. (IV SETS) IMPLANT
TUBING HIGH PRESSURE 120CM (CONNECTOR) ×3 IMPLANT
UNDERPAD 30X36 HEAVY ABSORB (UNDERPADS AND DIAPERS) IMPLANT
WATER STERILE IRR 1000ML POUR (IV SOLUTION) IMPLANT
WIRE BENTSON .035X145CM (WIRE) ×6 IMPLANT
WIRE ROSEN 145CM (WIRE) ×3 IMPLANT

## 2020-09-05 NOTE — Transfer of Care (Signed)
Immediate Anesthesia Transfer of Care Note  Patient: Don Massey  Procedure(s) Performed: LEFT LOWER EXTREMITY VENOGRAM, RIGHT INTERNAL JUGULAR APPROACH (Left ) ENDOVASCULAR STENT GRAFT INSERTION INTO INFERIOR VENA CAVA  Patient Location: PACU  Anesthesia Type:General  Level of Consciousness: awake and patient cooperative  Airway & Oxygen Therapy: Patient Spontanous Breathing and Patient connected to nasal cannula oxygen  Post-op Assessment: Report given to RN and Post -op Vital signs reviewed and stable  Post vital signs: Reviewed and stable  Last Vitals:  Vitals Value Taken Time  BP    Temp    Pulse 64 09/05/20 1253  Resp 12 09/05/20 1253  SpO2 100 % 09/05/20 1253  Vitals shown include unvalidated device data.  Last Pain:  Vitals:   09/05/20 0819  TempSrc:   PainSc: 0-No pain         Complications: No complications documented.

## 2020-09-05 NOTE — Interval H&P Note (Signed)
History and Physical Interval Note:  09/05/2020 9:53 AM  Don Massey  has presented today for surgery, with the diagnosis of DVT, MAY-THURNER SYNDROME.  The various methods of treatment have been discussed with the patient and family. After consideration of risks, benefits and other options for treatment, the patient has consented to  Procedure(s): LEFT LOWER EXTREMITY VENOGRAM, RIGHT INTERNAL JUGULAR APPROACH (Left) as a surgical intervention.  The patient's history has been reviewed, patient examined, no change in status, stable for surgery.  I have reviewed the patient's chart and labs.  Questions were answered to the patient's satisfaction.     [name]

## 2020-09-05 NOTE — Progress Notes (Addendum)
  Day of Surgery Note    Subjective:  No pain   Vitals:   09/05/20 1300 09/05/20 1315  BP: 129/76 129/74  Pulse: 73 63  Resp: 13 14  Temp:    SpO2: 100% 100%    Incisions:   Right neck and left groin venipuncture sites are soft without bleeding or hematoma Extremities:  Moves all well Cardiac:  RRR Lungs:  nonlabored resp Abdomen:  soft   Assessment/Plan:  This is a 64 y.o. male who is s/p  Left iliac vein Wallstent placement, venogram  -DC home. Pt has follow-up appointment made as per appt tab   Risa Grill, PA-C 09/05/2020 2:05 PM [phone]

## 2020-09-05 NOTE — Anesthesia Preprocedure Evaluation (Signed)

## 2020-09-05 NOTE — Discharge Instructions (Signed)
° °  [facility] [place] ° °Discharge Instructions ° °Lower Extremity Angiogram; Angioplasty/Stenting ° °Please refer to the following instructions for your post-procedure care. Your surgeon or physician assistant will discuss any changes with you. ° °Activity ° °Avoid lifting more than 8 pounds (1 gallons of milk) for 72 hours (3 days) after your procedure. You may walk as much as you can tolerate. It's OK to drive after 72 hours. ° °Bathing/Showering ° °You may shower the day after your procedure. If you have a bandage, you may remove it at 24- 48 hours. Clean your incision site with mild soap and water. Pat the area dry with a clean towel. ° °Diet ° °Resume your pre-procedure diet. There are no special food restrictions following this procedure. All patients with peripheral vascular disease should follow a low fat/low cholesterol diet. In order to heal from your surgery, it is CRITICAL to get adequate nutrition. Your body requires vitamins, minerals, and protein. Vegetables are the best source of vitamins and minerals. Vegetables also provide the perfect balance of protein. Processed food has little nutritional value, so try to avoid this. ° °Medications ° °Resume taking all of your medications unless your doctor tells you not to. If your incision is causing pain, you may take over-the-counter pain relievers such as acetaminophen (Tylenol) ° °Follow Up ° °Follow up will be arranged at the time of your procedure. You may have an office visit scheduled or may be scheduled for surgery. Ask your surgeon if you have any questions. ° °Please call us immediately for any of the following conditions: °•Severe or worsening pain your legs or feet at rest or with walking. °•Increased pain, redness, drainage at your groin puncture site. °•Fever of 101 degrees or higher. °•If you have any mild or slow bleeding from your puncture site: lie down, apply firm constant pressure over the area with a piece of  gauze or a clean wash cloth for 30 minutes- no peeking!, call 911 right away if you are still bleeding after 30 minutes, or if the bleeding is heavy and unmanageable. ° °Reduce your risk factors of vascular disease: ° °Stop smoking. If you would like help call [facility] at 1-800-QUIT-NOW (1-800-784-8669) or [facility] at 336-586-4000. °Manage your cholesterol °Maintain a desired weight °Control your diabetes °Keep your blood pressure down ° °If you have any questions, please call the office at [phone] ° °

## 2020-09-05 NOTE — Op Note (Signed)
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 Donzetta Matters, MD Assistants: Don Major, MD; Don Grill, PA; Don Chimes, MS 3 Procedure Performed: 1.  Ultrasound-guided cannulation left femoral vein 2.  Central venogram 3.  Ultrasound-guided cannulation right internal jugular vein 4.  Intravascular ultrasound of left common femoral vein, left common and external iliac veins, IVC 5.  Stent of left common and external iliac veins with 16 x 90 mm Wallstent 6.  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  He underwent attempted treatment from a left popliteal approach treatment could not be undertaken.  He is now indicated for second approach with general anesthesia from a femoral vein approach and internal jugular vein approach.  Findings: The internal jugular vein was patent and compressible.  We are able to get into the IVC without issue.  On the left side the femoral vein was dilated but was patent there was no clot there.  I did not identify any clot in the common femoral vein.  There was some chronic appearing thrombus by both venography and IVUS in the external iliac vein.  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  We were able to get through to the IVC and snare wire and have through and through access.  IVUS and venogram demonstrated what appeared to be chronic appearing thrombus and webbing in the IVC.  After balloon dilatation this really did not improve.  Where we previously were occluded after stenting in the common iliac vein we had a lumen of 10 mm.  This was stented from normal vein and the IVC back to normal vein and the external leg vein.   Procedure:  The patient was identified in the holding area and taken to the operating room where is placed supine operative when  general anesthesia was induced.  He was to the prepped draped in the bilateral neck bilateral groins and thighs in usual fashion antibiotics were administered a timeout was called.  Ultrasound was used to identify the femoral vein just below the common femoral vein.  This actually was patent and compressible although did appear to have some sluggish flow.  We cannulated this with micropuncture needle followed by wire sheath.  We then placed a 5 French sheath.  I performed venogram.  I then administered heparin total of 8000 units.  I then used a combination of Berenstein catheter and Glidewire and Glidewire advantage attempted to cross from below.  At this time Dr. Trula Massey then visualized the right IJ vein cannulated this with micropuncture needle followed by wire sheath.  He placed a Bentson wire into the IVC under fluoroscopic guidance placed along 7 French sheath into the IVC.  At this time I was able to cross with a quick cross catheter and Glidewire advantage into the IVC and confirmed intraluminal access.  He then snared from above we pulled through and through access.  We performed intravascular ultrasound with the above findings.  We then primarily ballooned dilated the IVC back into the common iliac external leg veins with a 60 mm balloon.  We stented with 16 x 90 mm stent and postdilated with a 16 balloon.  Completion demonstrated still residual webbing in the IVC we elected not to intervene upon given the patient has no right lower extremity symptoms.  We had a 10 mm lumen where  previously we were occluded right at the right common iliac artery crossing over the left common iliac vein.  We performed completion venography.  Our wire was removed.  Sheaths were pulled from the neck and leg pressure was held till hemostasis obtained.  He was awakened from anesthesia having tolerated procedure without any complication.  Contrast: 40 cc   Don Koral C. Donzetta Matters, MD [facility]  [place] Office: [phone] Pager: [phone]

## 2020-09-05 NOTE — Anesthesia Procedure Notes (Signed)
Procedure Name: Intubation Date/Time: 09/05/2020 10:39 AM Performed by: Renato Shin, CRNA Pre-anesthesia Checklist: Patient identified, Emergency Drugs available, Suction available and Patient being monitored Patient Re-evaluated:Patient Re-evaluated prior to induction Oxygen Delivery Method: Circle system utilized Preoxygenation: Pre-oxygenation with 100% oxygen Induction Type: IV induction Ventilation: Mask ventilation without difficulty Laryngoscope Size: Miller and 3 Grade View: Grade I Tube type: Oral Tube size: 7.5 mm Number of attempts: 1 Airway Equipment and Method: Stylet and Oral airway Placement Confirmation: ETT inserted through vocal cords under direct vision,  positive ETCO2 and breath sounds checked- equal and bilateral Secured at: 23 cm Tube secured with: Tape Dental Injury: Teeth and Oropharynx as per pre-operative assessment

## 2020-09-05 NOTE — Anesthesia Procedure Notes (Signed)
Arterial Line Insertion Start/End11/18/2021 9:35 AM Performed by: Janace Litten, CRNA, CRNA  Patient location: Pre-op. Preanesthetic checklist: patient identified, IV checked, risks and benefits discussed, monitors and equipment checked and pre-op evaluation Lidocaine 1% used for infiltration Left, radial was placed Catheter size: 20 G Hand hygiene performed  and maximum sterile barriers used  Allen's test indicative of satisfactory collateral circulation Attempts: 1 Procedure performed without using ultrasound guided technique. Following insertion, dressing applied and Biopatch. Post procedure assessment: normal  Patient tolerated the procedure well with no immediate complications.

## 2020-09-06 ENCOUNTER — Encounter (HOSPITAL_COMMUNITY): Payer: Self-pay | Admitting: Vascular Surgery

## 2020-09-06 ENCOUNTER — Telehealth: Payer: Self-pay | Admitting: *Deleted

## 2020-09-06 NOTE — Anesthesia Postprocedure Evaluation (Signed)
Anesthesia Post Note  Patient: RAUNEL DIMARTINO  Procedure(s) Performed: LEFT LOWER EXTREMITY VENOGRAM, RIGHT INTERNAL JUGULAR APPROACH (Left ) ENDOVASCULAR STENT GRAFT INSERTION INTO INFERIOR VENA CAVA     Patient location during evaluation: PACU Anesthesia Type: General Level of consciousness: awake and alert Pain management: pain level controlled Vital Signs Assessment: post-procedure vital signs reviewed and stable Respiratory status: spontaneous breathing, nonlabored ventilation, respiratory function stable and patient connected to nasal cannula oxygen Cardiovascular status: blood pressure returned to baseline and stable Postop Assessment: no apparent nausea or vomiting Anesthetic complications: no   No complications documented.  Last Vitals:  Vitals:   09/05/20 1405 09/05/20 1420  BP: 131/69 120/78  Pulse: 65 68  Resp: 13 19  Temp:  (!) 36.2 C  SpO2: 99% 100%    Last Pain:  Vitals:   09/05/20 1320  TempSrc:   PainSc: 0-No pain                 Ananiah Maciolek COKER

## 2020-09-06 NOTE — Telephone Encounter (Signed)
Spoke with pts wife regarding rash after venogram yesterday. Pt was pretreated for contrast allergy. It is ok for pt to continue taking benadryl and cortisone cream. Pt also asked if it was ok to take xaralto in the morning. Instructed pt to take xaralto with a full meal. Pt will call back if rash does not improve. Pt's wife verbalized understanding.

## 2020-09-16 ENCOUNTER — Other Ambulatory Visit: Payer: Self-pay | Admitting: *Deleted

## 2020-09-16 ENCOUNTER — Telehealth: Payer: Self-pay | Admitting: *Deleted

## 2020-09-16 DIAGNOSIS — I82422 Acute embolism and thrombosis of left iliac vein: Secondary | ICD-10-CM

## 2020-09-16 MED ORDER — APIXABAN 5 MG PO TABS: 5.0000 mg | ORAL_TABLET | Freq: Two times a day (BID) | ORAL | 0 refills | Status: DC

## 2020-09-16 NOTE — Telephone Encounter (Signed)
pts wife called to report continued rash on upper torso that has not resolved in several weeks. Patient has been taking benadryl and cortisone cream with little relief. Pt believes he may be allergic to [facility]. Pt was treated for potential contrast allergy previously. After speaking with Dr. Donzetta Matters. Pt was switched from [place] to eliquis.

## 2020-09-24 ENCOUNTER — Encounter (HOSPITAL_COMMUNITY): Payer: Commercial Managed Care - PPO

## 2020-09-24 ENCOUNTER — Encounter: Payer: Commercial Managed Care - PPO | Admitting: Vascular Surgery

## 2020-09-24 ENCOUNTER — Telehealth: Payer: Self-pay | Admitting: *Deleted

## 2020-09-24 NOTE — Telephone Encounter (Signed)
Spoke with pts wife. She reports pt fatigue, n/v for the last 3 days. Pt has not vomited today. They have contacted his pcp and have a telehealth appt with them later today. The patients rash has resolved since switching to eliquis but he is still itching. Eliquis was started on 11/30. Pt's wife inquired about stopping the blood thinners since the cause of the DVT had been fixed with a stent. Advised pt to follow his pcp's recommendations regarding the n/v and potential covid testing. I will follow up with them tomorrow regarding anticoagulation after talking with Dr. Donzetta Matters tomorrow. Pt's wife verbalized understanding.

## 2020-09-25 ENCOUNTER — Other Ambulatory Visit: Payer: Self-pay

## 2020-09-25 DIAGNOSIS — I824Y9 Acute embolism and thrombosis of unspecified deep veins of unspecified proximal lower extremity: Secondary | ICD-10-CM

## 2020-09-25 NOTE — Telephone Encounter (Signed)
Called pt's wife to discuss potentially switching pt's anticoagulation to BID lovenox. Pts n/v has improved and he had a negative covid and flu test. He is also feeling better today. Discussed the importance of pt being on anticoagulation for at least 6 months following stent placement. Pt will discuss anticoagulation options with his wife and call us back if they have any questions or wish to switch to lovenox from eliquis.

## 2020-10-04 ENCOUNTER — Other Ambulatory Visit: Payer: Self-pay

## 2020-10-04 ENCOUNTER — Ambulatory Visit (HOSPITAL_COMMUNITY)
Admission: RE | Admit: 2020-10-04 | Discharge: 2020-10-04 | Disposition: A | Discharge status: Home / Self Care | Payer: Commercial Managed Care - PPO | Source: Ambulatory Visit | Attending: Vascular Surgery | Admitting: Vascular Surgery

## 2020-10-04 ENCOUNTER — Encounter: Payer: Self-pay | Admitting: Vascular Surgery

## 2020-10-04 ENCOUNTER — Ambulatory Visit (INDEPENDENT_AMBULATORY_CARE_PROVIDER_SITE_OTHER): Payer: Commercial Managed Care - PPO | Admitting: Vascular Surgery

## 2020-10-04 ENCOUNTER — Encounter (HOSPITAL_COMMUNITY): Payer: Commercial Managed Care - PPO

## 2020-10-04 ENCOUNTER — Encounter: Payer: Commercial Managed Care - PPO | Admitting: Vascular Surgery

## 2020-10-04 VITALS — BP 142/86 | HR 50 | Temp 97.9°F | Resp 20 | Ht 76.0 in | Wt 227.0 lb

## 2020-10-04 DIAGNOSIS — I871 Compression of vein: Secondary | ICD-10-CM

## 2020-10-04 DIAGNOSIS — I824Y9 Acute embolism and thrombosis of unspecified deep veins of unspecified proximal lower extremity: Secondary | ICD-10-CM | POA: Insufficient documentation

## 2020-10-04 NOTE — Progress Notes (Signed)
    Subjective:     Patient ID: Don Massey, male   DOB: 10-30-1955, 64 y.o.   MRN: [id number]  HPI 64 year old male status post stenting of his left common external iliac veins.  He was having rashes with itching concern for contrast allergy also has had 2 episodes of severe nausea.  He has been switched from Xarelto to Eliquis subsequently has done better.  Left lower extremity feeling much better since surgery.   Review of Systems Itching, rash, nausea vomiting    Objective:   Physical Exam Vitals:   10/04/20 0915  BP: (!) 142/86  Pulse: (!) 50  Resp: 20  Temp: 97.9 F (36.6 C)  SpO2: 98%   Awake alert oriented Right neck incision well-healed Left posterior knee incision well-healed No swelling left lower extremity  IVC/iliac duplex Summary:  IVC/Iliac: Widely patent iliac vein stenting with no evidence of thrombus  involving the left common iliac vein or external iliac vein. Patent IVC.     Assessment:     64 year old male status post left common external leg vein stenting    Plan:     Stent patent today by duplex.  He will follow-up in 6 months with repeat duplex.  Continue Eliquis until that time.  If he has no issues then we can consider transitioning to lifelong antiplatelet therapy.  Don Massey C. Donzetta Matters, MD [facility] [place] Office: [phone] Pager: [phone]

## 2020-10-08 ENCOUNTER — Other Ambulatory Visit: Payer: Self-pay | Admitting: Vascular Surgery

## 2020-10-08 ENCOUNTER — Other Ambulatory Visit: Payer: Self-pay

## 2020-10-08 DIAGNOSIS — I871 Compression of vein: Secondary | ICD-10-CM

## 2020-10-08 DIAGNOSIS — I82422 Acute embolism and thrombosis of left iliac vein: Secondary | ICD-10-CM

## 2020-11-02 ENCOUNTER — Other Ambulatory Visit: Payer: Self-pay | Admitting: Vascular Surgery

## 2020-11-02 DIAGNOSIS — I82422 Acute embolism and thrombosis of left iliac vein: Secondary | ICD-10-CM

## 2020-12-11 ENCOUNTER — Other Ambulatory Visit: Payer: Self-pay | Admitting: Vascular Surgery

## 2020-12-11 ENCOUNTER — Other Ambulatory Visit: Payer: Self-pay

## 2020-12-11 DIAGNOSIS — I82422 Acute embolism and thrombosis of left iliac vein: Secondary | ICD-10-CM

## 2020-12-11 MED ORDER — APIXABAN 5 MG PO TABS: 5.0000 mg | ORAL_TABLET | Freq: Two times a day (BID) | ORAL | 5 refills | Status: DC

## 2021-04-04 ENCOUNTER — Encounter: Payer: Self-pay | Admitting: Vascular Surgery

## 2021-04-04 ENCOUNTER — Other Ambulatory Visit: Payer: Self-pay

## 2021-04-04 ENCOUNTER — Ambulatory Visit (HOSPITAL_COMMUNITY)
Admission: RE | Admit: 2021-04-04 | Discharge: 2021-04-04 | Disposition: A | Discharge status: Home / Self Care | Payer: Commercial Managed Care - PPO | Source: Ambulatory Visit | Attending: Vascular Surgery | Admitting: Vascular Surgery

## 2021-04-04 ENCOUNTER — Ambulatory Visit (INDEPENDENT_AMBULATORY_CARE_PROVIDER_SITE_OTHER): Payer: Commercial Managed Care - PPO | Admitting: Vascular Surgery

## 2021-04-04 VITALS — BP 146/83 | HR 50 | Temp 98.0°F | Resp 20 | Ht 76.0 in | Wt 238.0 lb

## 2021-04-04 DIAGNOSIS — I871 Compression of vein: Secondary | ICD-10-CM

## 2021-04-04 NOTE — Progress Notes (Signed)
Patient ID: CALAHAN Massey, male   DOB: 01-11-1956, 65 y.o.   MRN: [id number]  Reason for Consult: Follow-up   Referred by Gaynelle Arabian, MD  Subjective:     HPI:  Don Massey is a 65 y.o. 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  Initially was on Xarelto now taking Eliquis.  No further rashes from this.  No swelling in his left lower extremity.  Overall he has been doing well.  He does have some swelling of his right lower extremity with known likely occlusion filling his IVC via collaterals on the right from previous venography.  He is planning to have travel this summer and is about to have a grandchild and [place] which will necessitate significant flying in the near future.  He does not have any tissue loss or ulceration.  Past Medical History:  Diagnosis Date   Cataracts, bilateral    DVT (deep venous thrombosis) (HCC)    GERD (gastroesophageal reflux disease)    Hyperlipidemia    Peripheral vascular disease (HCC)    Thrombophlebitis 07/2018   Bilateral legs superficial   History reviewed. No pertinent family history. Past Surgical History:  Procedure Laterality Date   CATARACT EXTRACTION     ENDOVASCULAR STENT INSERTION  09/05/2020   Procedure: ENDOVASCULAR STENT GRAFT INSERTION INTO INFERIOR VENA CAVA;  Surgeon: [name], MD;  Location: [facility];  Service: Vascular;;   HAND SURGERY Right    Cyst removal   INTRAVASCULAR ULTRASOUND/IVUS Bilateral 08/14/2020   Procedure: Intravascular Ultrasound/IVUS;  Surgeon: Marty Heck, MD;  Location: [facility];  Service: Cardiovascular;  Laterality: Bilateral;  lower legs   LOWER EXTREMITY VENOGRAPHY Bilateral 08/14/2020   Procedure: LOWER EXTREMITY VENOGRAPHY;  Surgeon: Marty Heck, MD;  Location: [facility];  Service: Cardiovascular;  Laterality: Bilateral;   skin grafts     Legs and arms   UMBILICAL HERNIA  REPAIR     UMBILICAL HERNIA REPAIR N/A 05/21/2020   Procedure: LAPAROSCOPIC ASSISTED RECURRENT UMBILICAL HERNIA REPAIR WITH MESH;  Surgeon: Johnathan Hausen, MD;  Location: [facility];  Service: General;  Laterality: N/A;   VENOGRAM Left 09/05/2020   Procedure: LEFT LOWER EXTREMITY VENOGRAM, RIGHT INTERNAL JUGULAR APPROACH;  Surgeon: [name], MD;  Location: [facility];  Service: Vascular;  Laterality: Left;   WISDOM TOOTH EXTRACTION      Short Social History:  Social History   Tobacco Use   Smoking status: Never   Smokeless tobacco: Never  Substance Use Topics   Alcohol use: Yes    Alcohol/week: 0.0 standard drinks    Comment: rare    Allergies  Allergen Reactions   Codeine Nausea And Vomiting   Ivp Dye [Iodinated Diagnostic Agents] Rash    Current Outpatient Medications  Medication Sig Dispense Refill   ELIQUIS 5 MG TABS tablet TAKE 1 TABLET BY MOUTH TWICE A DAY 60 tablet 0   Multiple Vitamin (MULTIVITAMIN) tablet Take 1 tablet by mouth daily.     Omeprazole 20 MG TBEC Take 20 mg by mouth daily.   0   Polyethyl Glycol-Propyl Glycol (SYSTANE) 0.4-0.3 % SOLN Apply 1 drop to eye 2 (two) times daily as needed (dry eyes).      rosuvastatin (CRESTOR) 10 MG tablet Take 10 mg by mouth every Monday, Wednesday, and Friday.      No current facility-administered medications for this visit.    Review of Systems  Constitutional:  Constitutional negative. HENT: HENT negative.  Eyes: Eyes negative.  Respiratory: Respiratory negative.  Cardiovascular: Cardiovascular negative.  GI: Gastrointestinal negative.  Musculoskeletal: Musculoskeletal negative.  Skin: Skin negative.  Neurological: Neurological negative. Hematologic: Hematologic/lymphatic negative.  Psychiatric: Psychiatric negative.       Objective:  Objective   Vitals:   04/04/21 0842  BP: (!) 146/83  Pulse: (!) 50  Resp: 20  Temp: 98 F (36.7 C)  SpO2: 95%  Weight: 238 lb (108 kg)  Height: 6\' 4"  (1.93 m)    Body mass index is 28.97 kg/m.  Physical Exam HENT:     Head: Normocephalic.     Nose:     Comments: Wearing a mask Eyes:     Pupils: Pupils are equal, round, and reactive to light.  Cardiovascular:     Pulses: Normal pulses.  Pulmonary:     Effort: Pulmonary effort is normal.  Abdominal:     General: Abdomen is flat.     Palpations: Abdomen is soft. There is no mass.  Musculoskeletal:     Right lower leg: No edema.     Left lower leg: No edema.  Skin:    General: Skin is warm.     Capillary Refill: Capillary refill takes less than 2 seconds.  Neurological:     General: No focal deficit present.     Mental Status: He is alert.  Psychiatric:        Mood and Affect: Mood normal.        Behavior: Behavior normal.        Thought Content: Thought content normal.        Judgment: Judgment normal.    Data: IVC/Iliac Findings:  +----------+------+--------+--------+     IVC    PatentThrombusComments  +----------+------+--------+--------+  IVC Prox  patent                  +----------+------+--------+--------+  IVC Mid   patent                  +----------+------+--------+--------+  IVC Distalpatent                  +----------+------+--------+--------+      +-------------------+---------+-----------+---------+-----------+--------+          CIV        RT-PatentRT-ThrombusLT-PatentLT-ThrombusComments  +-------------------+---------+-----------+---------+-----------+--------+  Common Iliac Prox                       patent                       +-------------------+---------+-----------+---------+-----------+--------+  Common Iliac Mid                        patent                       +-------------------+---------+-----------+---------+-----------+--------+  Common Iliac Distal                     patent                       +-------------------+---------+-----------+---------+-----------+--------+        +-------------------------+---------+-----------+---------+-----------+----  ----+             EIV            RT-PatentRT-ThrombusLT-PatentLT-ThrombusComments  +-------------------------+---------+-----------+---------+-----------+----  ----+  External Iliac Vein Prox  patent                         +-------------------------+---------+-----------+---------+-----------+----  ----+  External Iliac Vein Mid                       patent                         +-------------------------+---------+-----------+---------+-----------+----  ----+  External Iliac Vein                           patent                         Distal                                                                       +-------------------------+---------+-----------+---------+-----------+----  ----+      Summary:  IVC/Iliac:     Patent IVC.  Patent left common and external iliac vein stents.      Assessment/Plan:     65 year old male status post stenting of his left common external neck veins for May Thurner syndrome with left common femoral vein thrombus.  Also likely has occlusion of the right side this is relatively asymptomatic.  We discussed stopping Eliquis and he will complete his course and then transition to baby aspirin daily indefinitely.  We discussed travel the need to remain hydrated and active during long flights.  He demonstrates good understanding we will follow him up in 6 months with repeat IVC iliac duplex at which time we will likely transition out to 1 year if he is not having no issues and the duplex is satisfactory.     [name] MD [facility]

## 2021-04-07 ENCOUNTER — Other Ambulatory Visit: Payer: Self-pay

## 2021-04-07 DIAGNOSIS — I871 Compression of vein: Secondary | ICD-10-CM

## 2021-10-08 ENCOUNTER — Other Ambulatory Visit: Payer: Self-pay

## 2021-10-08 ENCOUNTER — Ambulatory Visit (INDEPENDENT_AMBULATORY_CARE_PROVIDER_SITE_OTHER): Payer: Commercial Managed Care - PPO | Admitting: Vascular Surgery

## 2021-10-08 ENCOUNTER — Encounter: Payer: Self-pay | Admitting: Vascular Surgery

## 2021-10-08 ENCOUNTER — Ambulatory Visit (HOSPITAL_COMMUNITY)
Admission: RE | Admit: 2021-10-08 | Discharge: 2021-10-08 | Disposition: A | Discharge status: Home / Self Care | Payer: Commercial Managed Care - PPO | Source: Ambulatory Visit | Attending: Vascular Surgery | Admitting: Vascular Surgery

## 2021-10-08 VITALS — BP 159/83 | HR 50 | Temp 98.0°F | Resp 20 | Ht 76.0 in

## 2021-10-08 DIAGNOSIS — I871 Compression of vein: Secondary | ICD-10-CM | POA: Insufficient documentation

## 2021-10-08 NOTE — Progress Notes (Signed)
Patient ID: Don Massey, male   DOB: 1956/01/18, 65 y.o.   MRN: [id number]  Reason for Consult: Follow-up   Referred by Don Arabian, MD  Subjective:     HPI:  BRNADON Massey is a 65 y.o. 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  He was subsequently taking Eliquis this was discontinued last summer.  He has no swelling except occasionally after being on his feet for significant periods of time.  He has traveled has done very well with this he does wear compression stockings occasionally.  He takes aspirin daily.  Past Medical History:  Diagnosis Date   Cataracts, bilateral    DVT (deep venous thrombosis) (HCC)    GERD (gastroesophageal reflux disease)    Hyperlipidemia    Peripheral vascular disease (HCC)    Thrombophlebitis 07/2018   Bilateral legs superficial   History reviewed. No pertinent family history. Past Surgical History:  Procedure Laterality Date   CATARACT EXTRACTION     ENDOVASCULAR STENT INSERTION  09/05/2020   Procedure: ENDOVASCULAR STENT GRAFT INSERTION INTO INFERIOR VENA CAVA;  Surgeon: Don Sandy, MD;  Location: [place];  Service: Vascular;;   HAND SURGERY Right    Cyst removal   INTRAVASCULAR ULTRASOUND/IVUS Bilateral 08/14/2020   Procedure: Intravascular Ultrasound/IVUS;  Surgeon: Don Heck, MD;  Location: [place] [facility];  Service: Cardiovascular;  Laterality: Bilateral;  lower legs   LOWER EXTREMITY VENOGRAPHY Bilateral 08/14/2020   Procedure: LOWER EXTREMITY VENOGRAPHY;  Surgeon: Don Heck, MD;  Location: [place] [facility];  Service: Cardiovascular;  Laterality: Bilateral;   skin grafts     Legs and arms   UMBILICAL HERNIA REPAIR     UMBILICAL HERNIA REPAIR N/A 05/21/2020   Procedure: LAPAROSCOPIC ASSISTED RECURRENT UMBILICAL HERNIA REPAIR WITH MESH;  Surgeon: Don Hausen, MD;  Location: [facility];  Service: General;  Laterality: N/A;   VENOGRAM  Left 09/05/2020   Procedure: LEFT LOWER EXTREMITY VENOGRAM, RIGHT INTERNAL JUGULAR APPROACH;  Surgeon: Don Sandy, MD;  Location: [place];  Service: Vascular;  Laterality: Left;   WISDOM TOOTH EXTRACTION      Short Social History:  Social History   Tobacco Use   Smoking status: Never   Smokeless tobacco: Never  Substance Use Topics   Alcohol use: Yes    Alcohol/week: 0.0 standard drinks    Comment: rare    Allergies  Allergen Reactions   Codeine Nausea And Vomiting   Ivp Dye [Iodinated Diagnostic Agents] Rash    Current Outpatient Medications  Medication Sig Dispense Refill   aspirin EC 81 MG tablet Take 81 mg by mouth daily. Swallow whole.     Multiple Vitamin (MULTIVITAMIN) tablet Take 1 tablet by mouth daily.     Omeprazole 20 MG TBEC Take 20 mg by mouth daily.   0   Polyethyl Glycol-Propyl Glycol (SYSTANE) 0.4-0.3 % SOLN Apply 1 drop to eye 2 (two) times daily as needed (dry eyes).      rosuvastatin (CRESTOR) 10 MG tablet Take 10 mg by mouth every Monday, Wednesday, and Friday.      ELIQUIS 5 MG TABS tablet TAKE 1 TABLET BY MOUTH TWICE A DAY (Patient not taking: Reported on 10/08/2021) 60 tablet 0   No current facility-administered medications for this visit.    Review of Systems  HENT: HENT negative.  Eyes: Eyes negative.  Respiratory: Respiratory negative.  Cardiovascular: Cardiovascular negative.  GI: Gastrointestinal negative.  Musculoskeletal:  Musculoskeletal negative.  Skin: Skin negative.  Neurological: Neurological negative. Hematologic: Hematologic/lymphatic negative.  Psychiatric: Psychiatric negative.       Objective:  Objective   Vitals:   10/08/21 0852  BP: (!) 159/83  Pulse: (!) 50  Resp: 20  Temp: 98 F (36.7 C)  SpO2: 99%  Height: 6\' 4"  (1.93 m)   Body mass index is 28.97 kg/m.  Physical Exam Constitutional:      Appearance: Normal appearance.  HENT:     Head: Normocephalic.     Nose:     Comments: Wearing a  mask Eyes:     Pupils: Pupils are equal, round, and reactive to light.  Cardiovascular:     Rate and Rhythm: Normal rate.     Pulses: Normal pulses.  Pulmonary:     Effort: Pulmonary effort is normal.  Abdominal:     General: Abdomen is flat.     Palpations: Abdomen is soft.  Musculoskeletal:     Right lower leg: No edema.     Left lower leg: No edema.     Comments: Right leg measures 42cm left leg 41cm  Skin:    General: Skin is warm and dry.     Capillary Refill: Capillary refill takes less than 2 seconds.  Neurological:     General: No focal deficit present.     Mental Status: He is alert and oriented to person, place, and time.  Psychiatric:        Mood and Affect: Mood normal.        Behavior: Behavior normal.        Thought Content: Thought content normal.        Judgment: Judgment normal.    Data: IVC/Iliac Findings:  +----------+------+--------+--------+      IVC     Patent Thrombus Comments   +----------+------+--------+--------+   IVC Prox   patent                     +----------+------+--------+--------+   IVC Mid    patent                     +----------+------+--------+--------+   IVC Distal patent                     +----------+------+--------+--------+      +-------------------+---------+-----------+---------+-----------+--------+           CIV         RT-Patent RT-Thrombus LT-Patent LT-Thrombus Comments   +-------------------+---------+-----------+---------+-----------+--------+   Common Iliac Prox    patent                patent                          +-------------------+---------+-----------+---------+-----------+--------+   Common Iliac Mid     patent                patent                          +-------------------+---------+-----------+---------+-----------+--------+   Common Iliac Distal  patent                patent                stent     +-------------------+---------+-----------+---------+-----------+--------+        +-------------------------+---------+-----------+---------+-----------+----  ----+  EIV             RT-Patent RT-Thrombus LT-Patent LT-Thrombus Comments   +-------------------------+---------+-----------+---------+-----------+----  ----+   External Iliac Vein Prox   patent                patent                 stent     +-------------------------+---------+-----------+---------+-----------+----  ----+   External Iliac Vein Mid    patent                patent                            +-------------------------+---------+-----------+---------+-----------+----  ----+   External Iliac Vein        patent                patent                             Distal                                                                             +-------------------------+---------+-----------+---------+-----------+----  ----+          Summary:  IVC/Iliac: Patent IVC with no visualized thrombus.  Patent right common and external iliac vein with no visualized thrombus.  Patent left common and external iliac vein stent.         Assessment/Plan:     65 year old male doing very well status post left common and external iliac vein stenting.  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  He will remain on aspirin.  We have discussed his higher than average risk of recurrent DVT particularly within the next 4 years he demonstrates good understanding and knows the signs and symptoms to be aware of.  Short of this he will follow-up in 1 year with repeat IVC iliac duplex and continue aspirin indefinitely although he can be stopped for procedures.     Don Sandy MD [facility]

## 2021-10-10 IMAGING — CT CT VENOGRAM ABD-PELV
1 of 3 series · 12 of 32 positions shown, 17 images · IV contrast (APPLIED)
Comparison: None.

CLINICAL DATA: 63-year-old male with a history of left lower
extremity DVT

EXAM:
CTA ABDOMEN AND PELVIS WITH CONTRAST
TECHNIQUE: Multidetector CT imaging of the abdomen and pelvis was performed
using the standard protocol during bolus administration of
intravenous contrast. Multiplanar reconstructed images and MIPs were
obtained and reviewed to evaluate the vascular anatomy.
CONTRAST:  125mL DIWPZR-Z9U IOPAMIDOL (DIWPZR-Z9U) INJECTION 76%

[Series 3: venous delay · axial · portal-venous · 0.82mm/px · z∈[-533,-148]mm · 12 of 91 slices shown, 17 images]
[im 7/91  soft-tissue]
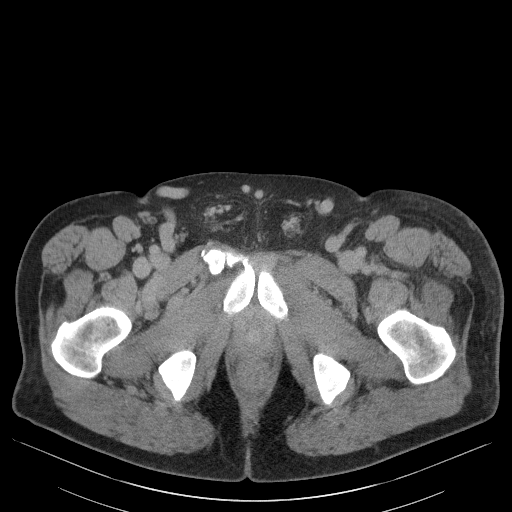
[im 7/91  bone]
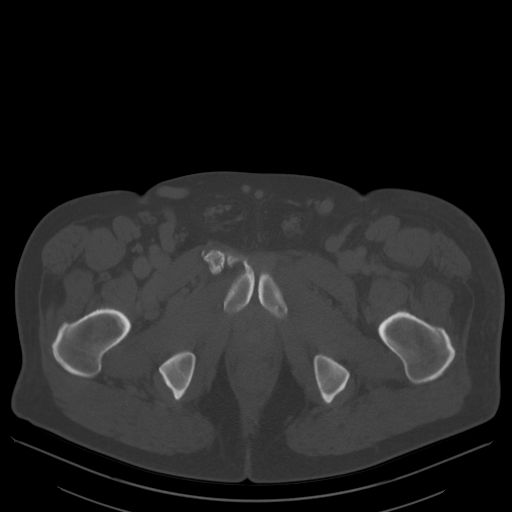
[im 13/91  soft-tissue]
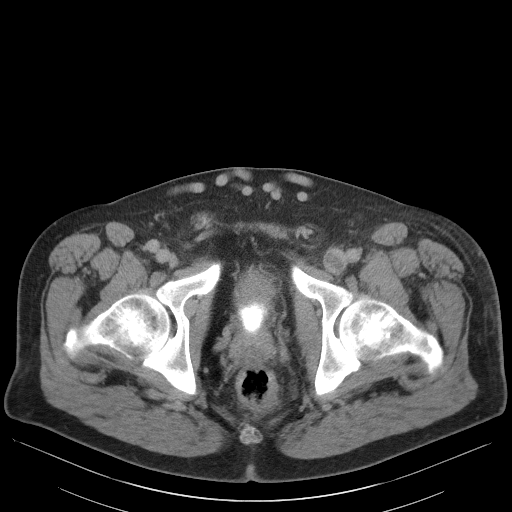
[im 20/91  soft-tissue]
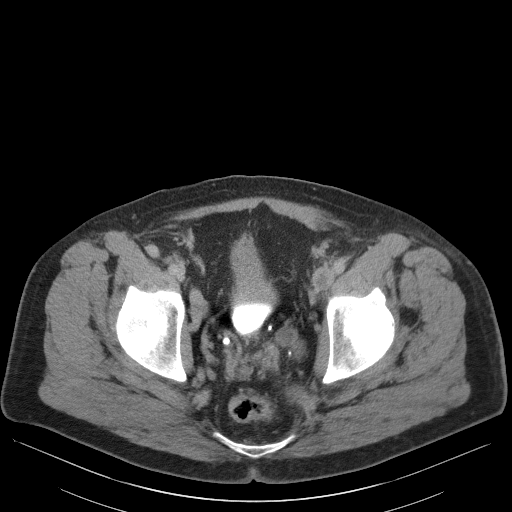
[im 33/91  soft-tissue]
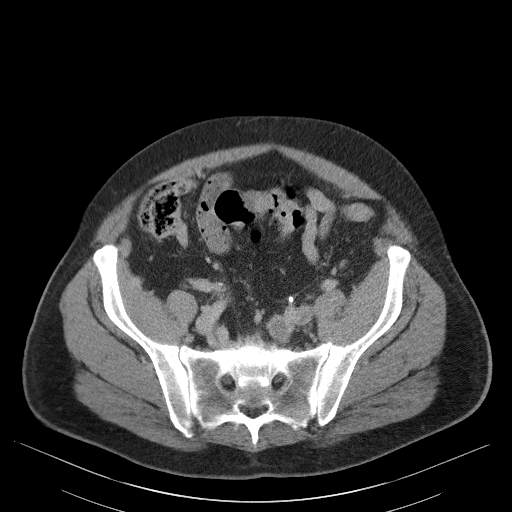
[im 39/91  soft-tissue]
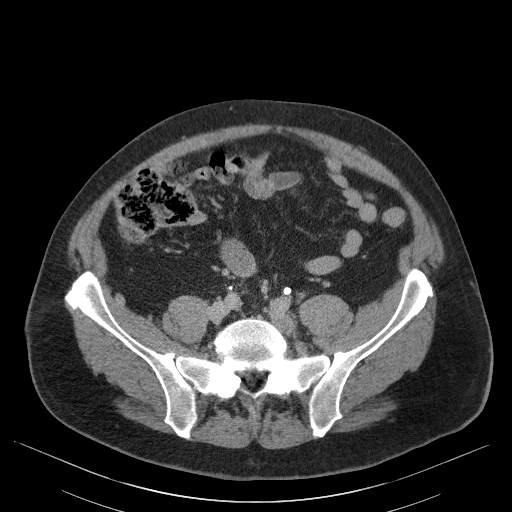
[im 46/91  soft-tissue]
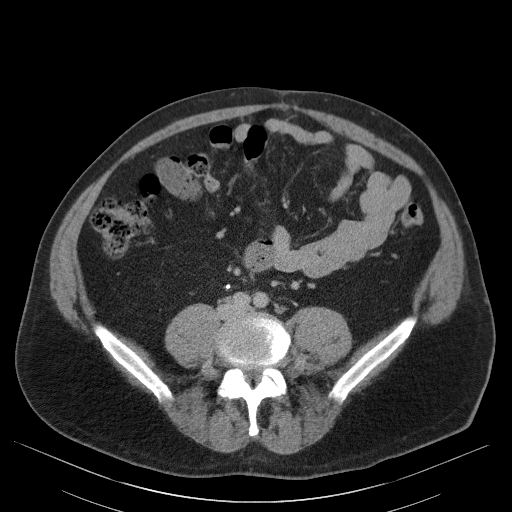
[im 52/91  soft-tissue]
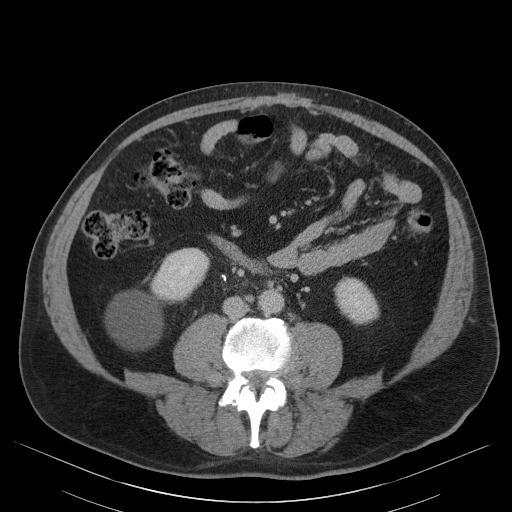
[im 58/91  soft-tissue]
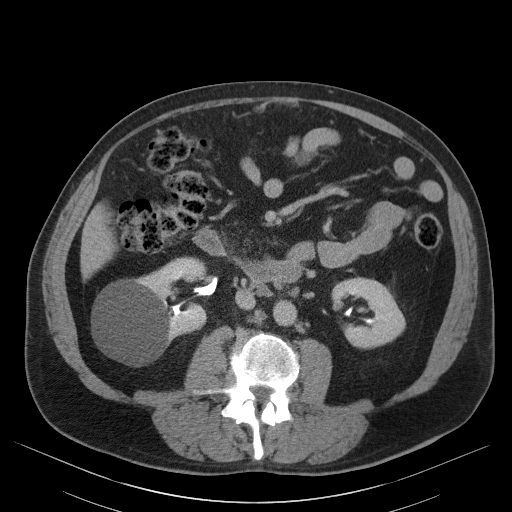
[im 65/91  lung]
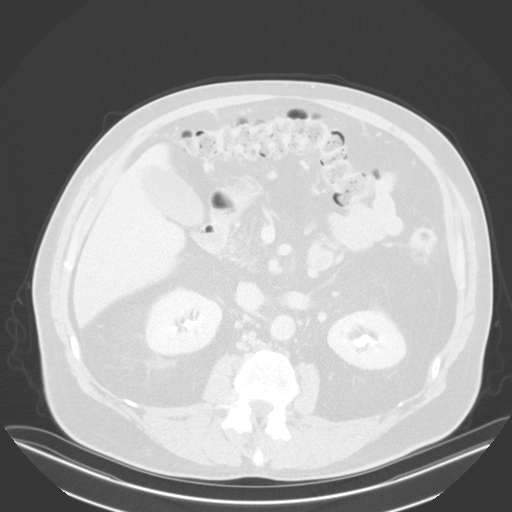
[im 71/91  soft-tissue]
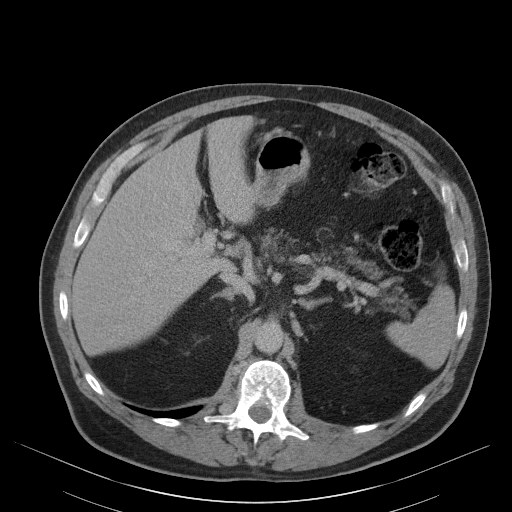
[im 71/91  lung]
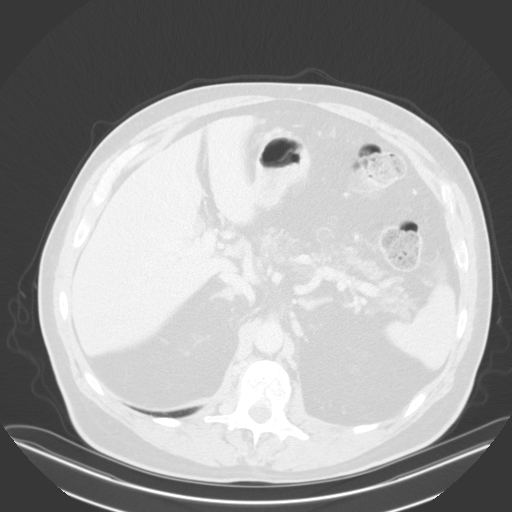
[im 71/91  bone]
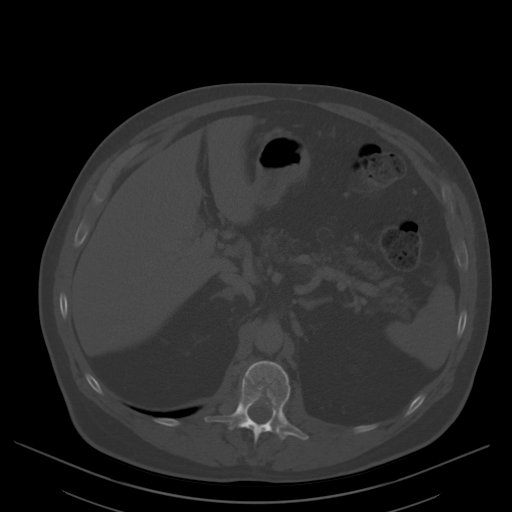
[im 78/91  soft-tissue]
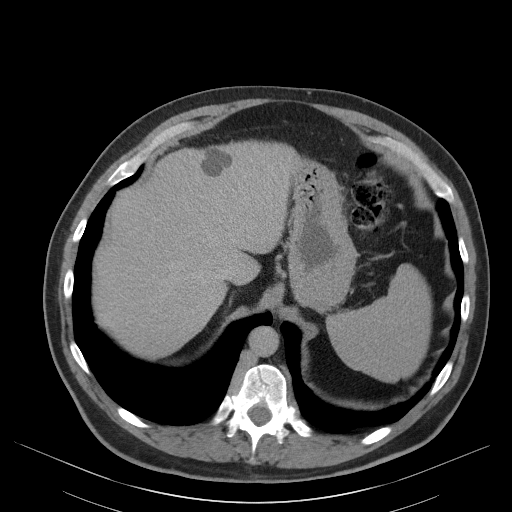
[im 78/91  lung]
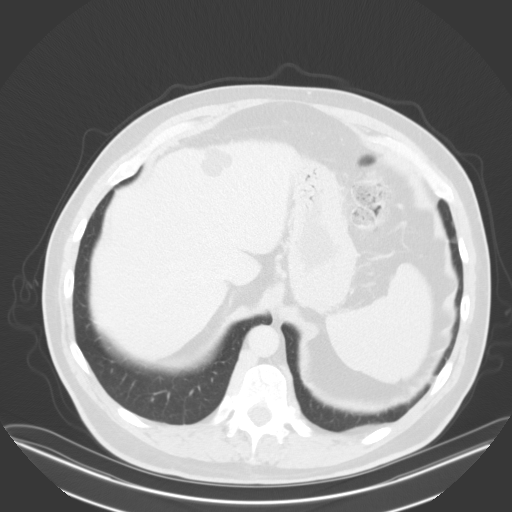
[im 84/91  soft-tissue]
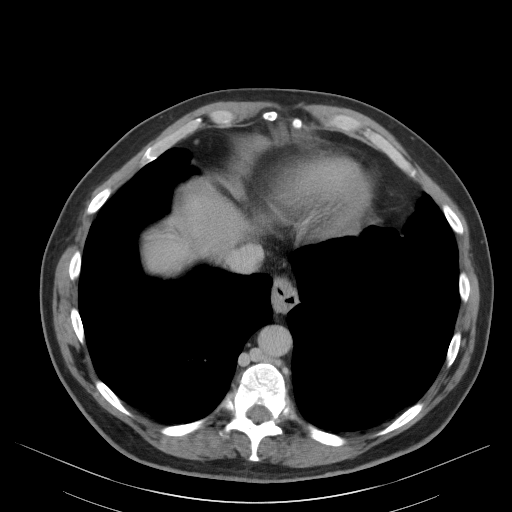
[im 84/91  lung]
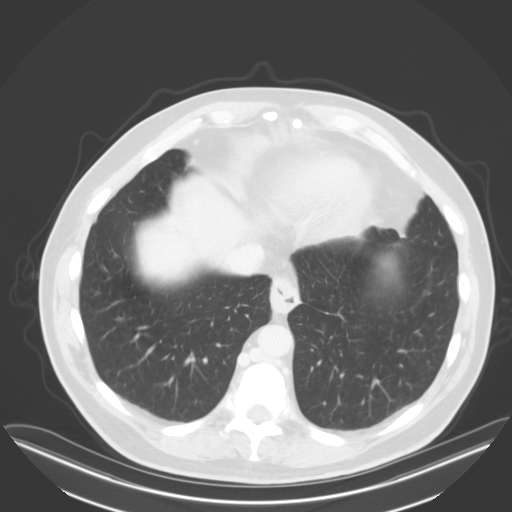

[12 of 32 positions shown; findings below may reference images not displayed]

FINDINGS: VASCULAR

Aorta: Unremarkable course, caliber, contour of the abdominal aorta.
No dissection, aneurysm, or periaortic fluid.

Celiac: Patent, with no significant atherosclerotic changes.

SMA: Patent, with no significant atherosclerotic changes.

Renals:

- Right: Right renal artery patent.

- Left: Left renal artery patent.

IMA: Inferior mesenteric artery is patent.

Right lower extremity:

Unremarkable course, caliber, and contour of the right iliac system.
No aneurysm, dissection, or occlusion. Hypogastric artery is patent.
Common femoral artery patent. Proximal SFA and profunda femoris
patent.

Left lower extremity:

Unremarkable course, caliber, and contour of the left iliac system.
No aneurysm, dissection, or occlusion. Hypogastric artery is patent.
Common femoral artery patent. Proximal SFA and profunda femoris
patent.

Veins:

Portal system:

Unremarkable appearance of the portal venous system.

Systemic veins:

Central filling defects in the proximal left femoral venous system,
involving common femoral vein extending into the left external iliac
vein. Filling defects extend through the common iliac vein. Common
iliac vein is compressed by the overlying arterial vasculature at
the L5 level. Filling defects extend into the internal iliac veins
on the left with distension of the internal iliac veins compatible
with thrombophlebitis.

Collateral superficial draining veins through anterior wall pelvic
collaterals cross the pubic fat, into the external pudendal/inferior
epigastric venous system and the right systemic venous system.

No evidence of right-sided DVT.

No filling defects within the IVC.

The first phase of the IVC demonstrates a linear filling defect
extending from the iliac vein confluence of the IVC, superiorly to
just below the lowest right renal vein. This is better seen on the
coronal images.

Distension of the paravertebral venous system draining into the
azygos system, as collateral flow.

Review of the MIP images confirms the above findings.

NON-VASCULAR

Lower chest: No acute.

Hepatobiliary: Low-density nonenhancing cystic lesion of the left
liver measures 2.7 cm, likely a benign biliary cyst. Otherwise
unremarkable liver. Unremarkable gallbladder.

Pancreas: Unremarkable.

Spleen: Unremarkable.

Adrenals/Urinary Tract:

- Right adrenal gland: Unremarkable

- Left adrenal gland: Unremarkable.

- Right kidney: No hydronephrosis, nephrolithiasis, inflammation, or
ureteral dilation. Low-density nonenhancing and non complex cyst of
the lower right kidney, compatible with Bosniak 1 cyst.

- Left Kidney: No hydronephrosis, nephrolithiasis, inflammation, or
ureteral dilation. No focal lesion.

- Urinary Bladder: Unremarkable.

Stomach/Bowel:

- Stomach: Unremarkable.

- Small bowel: Hiatal hernia.  Otherwise unremarkable stomach.

- Appendix: Normal.

- Colon: Mild to moderate stool burden. No focal inflammatory
changes. No transition point. Diverticular disease without evidence
of acute diverticulitis.

Lymphatic: No adenopathy.

Mesenteric: No free fluid or air. No mesenteric adenopathy.

Reproductive: Unremarkable prostate/pelvic organs.

Other: Surgical changes of prior hernia repair.

Musculoskeletal: No acute displaced fracture. Mild degenerative
changes without bony canal narrowing.
IMPRESSION: CT confirms left iliac DVT with Hullah anatomy. DVT involves
the left common iliac vein, internal iliac veins, external iliac
veins, and the left common femoral vein.

There is either a dissection flap or a chronic venous web of the IVC
extending from the iliac vein confluence to below the right renal
vein. This does not limit flow in the IVC.

Additional ancillary findings as above.

## 2021-10-23 ENCOUNTER — Encounter: Payer: Self-pay | Admitting: Podiatry

## 2021-10-23 ENCOUNTER — Other Ambulatory Visit: Payer: Self-pay

## 2021-10-23 ENCOUNTER — Ambulatory Visit (INDEPENDENT_AMBULATORY_CARE_PROVIDER_SITE_OTHER): Payer: Commercial Managed Care - PPO | Admitting: Podiatry

## 2021-10-23 ENCOUNTER — Ambulatory Visit (INDEPENDENT_AMBULATORY_CARE_PROVIDER_SITE_OTHER): Payer: Commercial Managed Care - PPO

## 2021-10-23 DIAGNOSIS — M722 Plantar fascial fibromatosis: Secondary | ICD-10-CM

## 2021-10-23 MED ORDER — TRIAMCINOLONE ACETONIDE 10 MG/ML IJ SUSP
10.0000 mg | Freq: Once | INTRAMUSCULAR | Status: AC
  Administered 2021-10-23: 10 mg

## 2021-10-23 MED ORDER — DICLOFENAC SODIUM 75 MG PO TBEC: 75.0000 mg | DELAYED_RELEASE_TABLET | Freq: Two times a day (BID) | ORAL | 2 refills | Status: DC

## 2021-10-23 NOTE — Patient Instructions (Signed)

## 2021-10-24 NOTE — Progress Notes (Signed)
Subjective:   Patient ID: Don Massey, male   DOB: 66 y.o.   MRN: [id number]   HPI Patient presents stating that he has developed a lot of pain in the plantar aspect of his left heel over the last couple months and he tries to be quite active and it is been more of an issue for him recently.  States is worse when getting out patient does not smoke likes to be active   Review of Systems  All other systems reviewed and are negative.      Objective:  Physical Exam Vitals and nursing note reviewed.  Constitutional:      Appearance: He is well-developed.  Pulmonary:     Effort: Pulmonary effort is normal.  Musculoskeletal:        General: Normal range of motion.  Skin:    General: Skin is warm.  Neurological:     Mental Status: He is alert.    Neurovascular status found to be intact muscle strength found to be adequate range of motion adequate exquisite discomfort plantar heel left at insertion of the tendon into the calcaneus with fluid around the medial band good digital perfusion well oriented x3 with patient no longer on Eliquis currently     Assessment:  Acute plantar fasciitis left with inflammation fluid medial band with moderate depression of the arch     Plan:  H&P x-rays reviewed did sterile prep injected the plantar fascial left 3 mg Kenalog 5 mg Xylocaine applied fascial brace instructed on physical therapy shoe gear modifications prescribed diclofenac 75 mg twice daily reappoint to recheck  X-rays indicate that there is small spur no indication stress fracture arthritis

## 2021-11-13 ENCOUNTER — Ambulatory Visit (INDEPENDENT_AMBULATORY_CARE_PROVIDER_SITE_OTHER): Payer: Commercial Managed Care - PPO | Admitting: Podiatry

## 2021-11-13 ENCOUNTER — Encounter: Payer: Self-pay | Admitting: Podiatry

## 2021-11-13 ENCOUNTER — Other Ambulatory Visit: Payer: Self-pay

## 2021-11-13 DIAGNOSIS — M722 Plantar fascial fibromatosis: Secondary | ICD-10-CM

## 2021-11-13 MED ORDER — TRIAMCINOLONE ACETONIDE 10 MG/ML IJ SUSP
10.0000 mg | Freq: Once | INTRAMUSCULAR | Status: AC
  Administered 2021-11-13: 10 mg

## 2021-11-15 NOTE — Progress Notes (Signed)
Subjective:   Patient ID: Don Massey, male   DOB: 66 y.o.   MRN: [id number]   HPI Patient presents stating that he is getting pain back in his left heel and has moved more to the back of the heel on the outside and the center portion of the heel.  States its hard for him to bear weight down on the foot   ROS      Objective:  Physical Exam  Neurovascular status intact with medial pain which has resolved with previous injection which pain is more proximal more lateral and central and is more difficult to get better with purely medication     Assessment:  Significant fasciitis-like symptomatology left localized to the center and center lateral aspect of the heel     Plan:  H&P reviewed condition and I recommended careful injection of the center and center lateral aspect of the heel with inflammatory markers and I went ahead today and I applied air fracture walker to completely immobilize the plantar heel and take all weightbearing pressure off.  Gave instructions on using this and reappoint to recheck

## 2022-07-19 ENCOUNTER — Other Ambulatory Visit: Payer: Self-pay | Admitting: Podiatry

## 2022-12-18 ENCOUNTER — Other Ambulatory Visit: Payer: Self-pay | Admitting: *Deleted

## 2022-12-18 DIAGNOSIS — I82409 Acute embolism and thrombosis of unspecified deep veins of unspecified lower extremity: Secondary | ICD-10-CM

## 2022-12-30 ENCOUNTER — Ambulatory Visit (HOSPITAL_COMMUNITY)
Admission: RE | Admit: 2022-12-30 | Discharge: 2022-12-30 | Disposition: A | Discharge status: Home / Self Care | Payer: Commercial Managed Care - PPO | Source: Ambulatory Visit | Attending: Vascular Surgery | Admitting: Vascular Surgery

## 2022-12-30 ENCOUNTER — Encounter: Payer: Self-pay | Admitting: Vascular Surgery

## 2022-12-30 ENCOUNTER — Ambulatory Visit (INDEPENDENT_AMBULATORY_CARE_PROVIDER_SITE_OTHER): Payer: Commercial Managed Care - PPO | Admitting: Vascular Surgery

## 2022-12-30 VITALS — BP 151/90 | HR 53 | Temp 98.1°F | Resp 20 | Ht 76.0 in | Wt 227.0 lb

## 2022-12-30 DIAGNOSIS — I82409 Acute embolism and thrombosis of unspecified deep veins of unspecified lower extremity: Secondary | ICD-10-CM

## 2022-12-30 DIAGNOSIS — I871 Compression of vein: Secondary | ICD-10-CM

## 2022-12-30 NOTE — Progress Notes (Signed)
Patient ID: Don Massey, male   DOB: [birth year], 67 y.o.   MRN: [id number]  Reason for Consult: Follow-up   Referred by Gaynelle Arabian, MD  Subjective:     HPI:  Don Massey is a 67 y.o. male had a history of a stent of the left common external iliac veins about 2 and half years ago.  Last followed up about 15 months ago.  Swelling in the left lower extremity has fully resolved he is wearing compression stockings and is on aspirin.  He does have some swelling in his right lower extremity where he has previous trauma when he was a child.  He also has some new varicosities showing up on the right leg and has some occasional weakness in that leg but otherwise does not have any significant pain.  Past Medical History:  Diagnosis Date   Cataracts, bilateral    DVT (deep venous thrombosis) (HCC)    GERD (gastroesophageal reflux disease)    Hyperlipidemia    Peripheral vascular disease (HCC)    Thrombophlebitis 07/2018   Bilateral legs superficial   History reviewed. No pertinent family history. Past Surgical History:  Procedure Laterality Date   CATARACT EXTRACTION     ENDOVASCULAR STENT INSERTION  09/05/2020   Procedure: ENDOVASCULAR STENT GRAFT INSERTION INTO INFERIOR VENA CAVA;  Surgeon: [name], MD;  Location: [place];  Service: Vascular;;   HAND SURGERY Right    Cyst removal   INTRAVASCULAR ULTRASOUND/IVUS Bilateral 08/14/2020   Procedure: Intravascular Ultrasound/IVUS;  Surgeon: Marty Heck, MD;  Location: [place] [facility];  Service: Cardiovascular;  Laterality: Bilateral;  lower legs   LOWER EXTREMITY VENOGRAPHY Bilateral 08/14/2020   Procedure: LOWER EXTREMITY VENOGRAPHY;  Surgeon: Marty Heck, MD;  Location: [place] [facility];  Service: Cardiovascular;  Laterality: Bilateral;   skin grafts     Legs and arms   UMBILICAL HERNIA REPAIR     UMBILICAL HERNIA REPAIR N/A 05/21/2020   Procedure: LAPAROSCOPIC ASSISTED RECURRENT UMBILICAL  HERNIA REPAIR WITH MESH;  Surgeon: Johnathan Hausen, MD;  Location: [facility];  Service: General;  Laterality: N/A;   VENOGRAM Left 09/05/2020   Procedure: LEFT LOWER EXTREMITY VENOGRAM, RIGHT INTERNAL JUGULAR APPROACH;  Surgeon: [name], MD;  Location: [place];  Service: Vascular;  Laterality: Left;   WISDOM TOOTH EXTRACTION      Short Social History:  Social History   Tobacco Use   Smoking status: Never   Smokeless tobacco: Never  Substance Use Topics   Alcohol use: Yes    Alcohol/week: 0.0 standard drinks of alcohol    Comment: rare    Allergies  Allergen Reactions   Codeine Nausea And Vomiting   Ivp Dye [Iodinated Contrast Media] Rash    Current Outpatient Medications  Medication Sig Dispense Refill   aspirin EC 81 MG tablet Take 81 mg by mouth daily. Swallow whole.     Multiple Vitamin (MULTIVITAMIN) tablet Take 1 tablet by mouth daily.     Omeprazole 20 MG TBEC Take 20 mg by mouth daily.   0   Polyethyl Glycol-Propyl Glycol (SYSTANE) 0.4-0.3 % SOLN Apply 1 drop to eye 2 (two) times daily as needed (dry eyes).      rosuvastatin (CRESTOR) 10 MG tablet Take 10 mg by mouth every Monday, Wednesday, and Friday.      spironolactone (ALDACTONE) 25 MG tablet Take 25 mg by mouth daily.     No current facility-administered medications for this visit.  Review of Systems  Constitutional:  Constitutional negative. HENT: HENT negative.  Eyes: Eyes negative.  Respiratory: Respiratory negative.  Cardiovascular: Cardiovascular negative.  GI: Gastrointestinal negative.  Musculoskeletal: Musculoskeletal negative.  Neurological: Positive for focal weakness.  Hematologic: Positive for bruises/bleeds easily.  Psychiatric: Psychiatric negative.        Objective:  Objective   Vitals:   12/30/22 0826  BP: (!) 151/90  Pulse: (!) 53  Resp: 20  Temp: 98.1 F (36.7 C)  SpO2: 97%  Weight: 227 lb (103 kg)  Height: '6\' 4"'$  (1.93 m)   Body mass index is 27.63  kg/m.  Physical Exam HENT:     Head: Normocephalic.     Nose: Nose normal.  Eyes:     Pupils: Pupils are equal, round, and reactive to light.  Cardiovascular:     Rate and Rhythm: Normal rate.     Pulses: Normal pulses.  Pulmonary:     Effort: Pulmonary effort is normal.  Abdominal:     General: Abdomen is flat.     Palpations: Abdomen is soft.  Musculoskeletal:        General: Normal range of motion.     Cervical back: Normal range of motion and neck supple.     Right lower leg: Edema present.     Left lower leg: No edema.  Skin:    General: Skin is warm.     Capillary Refill: Capillary refill takes less than 2 seconds.  Neurological:     General: No focal deficit present.     Mental Status: He is alert.  Psychiatric:        Mood and Affect: Mood normal.        Behavior: Behavior normal.        Thought Content: Thought content normal.        Judgment: Judgment normal.     Data: IVC/Iliac Findings:  +----------+------+--------+--------+    IVC    PatentThrombusComments  +----------+------+--------+--------+  IVC Prox  patent                  +----------+------+--------+--------+  IVC Mid   patent                  +----------+------+--------+--------+  IVC Distalpatent                  +----------+------+--------+--------+     +-------------------+---------+-----------+---------+-----------+--------+         CIV        RT-PatentRT-ThrombusLT-PatentLT-ThrombusComments  +-------------------+---------+-----------+---------+-----------+--------+  Common Iliac Prox   patent              patent                       +-------------------+---------+-----------+---------+-----------+--------+  Common Iliac Mid    patent              patent                       +-------------------+---------+-----------+---------+-----------+--------+  Common Iliac Distal patent              patent                        +-------------------+---------+-----------+---------+-----------+--------+      +-------------------------+---------+-----------+---------+-----------+----  ----+            EIV            RT-PatentRT-ThrombusLT-PatentLT-ThrombusComments  +-------------------------+---------+-----------+---------+-----------+----  ----+  External Iliac Vein  Prox  patent              patent                        +-------------------------+---------+-----------+---------+-----------+----  ----+  External Iliac Vein Mid   patent              patent                        +-------------------------+---------+-----------+---------+-----------+----  ----+  External Iliac Vein       patent              patent                        Distal                                                                      +-------------------------+---------+-----------+---------+-----------+----  ----+        Summary:  IVC/Iliac: Patent IVC with no visualized thrombus.  Patent right common and external iliac vein with no visualized thrombus.  Patent left common and external iliac vein stent.         Assessment/Plan:     67 year old male with history of vein stenting on the left did have an abnormality in the veins on the right and this abnormality was confirmed with venography and CT venogram.  Now asymptomatic on the left with minimal symptoms on the right.  I recommended continued compression stockings and exercise as tolerated.  He continues aspirin.  He will follow-up in 1 year with repeat IVC iliac duplex to evaluate his previous stenting.     [name] MD [facility]

## 2023-12-29 ENCOUNTER — Other Ambulatory Visit: Payer: Self-pay

## 2023-12-29 DIAGNOSIS — I871 Compression of vein: Secondary | ICD-10-CM

## 2024-01-05 ENCOUNTER — Ambulatory Visit (INDEPENDENT_AMBULATORY_CARE_PROVIDER_SITE_OTHER): Payer: Commercial Managed Care - PPO | Admitting: Physician Assistant

## 2024-01-05 ENCOUNTER — Ambulatory Visit (HOSPITAL_COMMUNITY)
Admission: RE | Admit: 2024-01-05 | Discharge: 2024-01-05 | Disposition: A | Discharge status: Home / Self Care | Payer: Commercial Managed Care - PPO | Source: Ambulatory Visit | Attending: Vascular Surgery | Admitting: Vascular Surgery

## 2024-01-05 VITALS — BP 158/85 | HR 52 | Temp 98.0°F | Ht 76.0 in | Wt 227.1 lb

## 2024-01-05 DIAGNOSIS — I871 Compression of vein: Secondary | ICD-10-CM | POA: Diagnosis present

## 2024-01-05 NOTE — Progress Notes (Signed)
 Office Note  History of Present Illness   Don Massey is a 68 y.o. (Mar 06, 1956) male who presents for follow up.  He has a history of left common and external iliac vein stenting on 09/05/2020 by Dr. Randie Heinz.  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  His left lower extremity swelling fully resolved after stenting.  He returns today for follow-up.  He says that he has been doing great since his last follow-up.  He denies any issues with pain in his lower extremities.  He denies any issues with lower extremity swelling.  He does note an increasing amount of small varicosities around his right ankle, however these do not cause him any symptoms.  Past Medical History:  Diagnosis Date   Cataracts, bilateral    DVT (deep venous thrombosis) (HCC)    GERD (gastroesophageal reflux disease)    Hyperlipidemia    Peripheral vascular disease (HCC)    Thrombophlebitis 07/2018   Bilateral legs superficial    Past Surgical History:  Procedure Laterality Date   CATARACT EXTRACTION     CORONARY ULTRASOUND/IVUS Bilateral 08/14/2020   Procedure: Intravascular Ultrasound/IVUS;  Surgeon: Cephus Shelling, MD;  Location: [facility];  Service: Cardiovascular;  Laterality: Bilateral;  lower legs   ENDOVASCULAR STENT INSERTION  09/05/2020   Procedure: ENDOVASCULAR STENT GRAFT INSERTION INTO INFERIOR VENA CAVA;  Surgeon: Maeola Harman, MD;  Location: [facility] OR;  Service: Vascular;;   HAND SURGERY Right    Cyst removal   LOWER EXTREMITY VENOGRAPHY Bilateral 08/14/2020   Procedure: LOWER EXTREMITY VENOGRAPHY;  Surgeon: Cephus Shelling, MD;  Location: [facility];  Service: Cardiovascular;  Laterality: Bilateral;   skin grafts     Legs and arms   UMBILICAL HERNIA REPAIR     UMBILICAL HERNIA REPAIR N/A 05/21/2020   Procedure: LAPAROSCOPIC ASSISTED RECURRENT UMBILICAL HERNIA REPAIR WITH MESH;  Surgeon: Luretha Murphy, MD;   Location: [facility] ORS;  Service: General;  Laterality: N/A;   VENOGRAM Left 09/05/2020   Procedure: LEFT LOWER EXTREMITY VENOGRAM, RIGHT INTERNAL JUGULAR APPROACH;  Surgeon: Maeola Harman, MD;  Location: [facility] OR;  Service: Vascular;  Laterality: Left;   WISDOM TOOTH EXTRACTION      Social History   Socioeconomic History   Marital status: Married    Spouse name: Not on file   Number of children: Not on file   Years of education: Not on file   Highest education level: Not on file  Occupational History   Not on file  Tobacco Use   Smoking status: Never   Smokeless tobacco: Never  Vaping Use   Vaping status: Never Used  Substance and Sexual Activity   Alcohol use: Yes    Alcohol/week: 0.0 standard drinks of alcohol    Comment: rare   Drug use: Never   Sexual activity: Not on file  Other Topics Concern   Not on file  Social History Narrative   Not on file   Social Drivers of Health   Financial Resource Strain: Not on file  Food Insecurity: Not on file  Transportation Needs: Not on file  Physical Activity: Not on file  Stress: Not on file  Social Connections: Not on file  Intimate Partner Violence: Not on file   History reviewed. No pertinent family history.  Current Outpatient Medications  Medication Sig Dispense Refill   aspirin EC 81 MG tablet Take 81  mg by mouth daily. Swallow whole.     Multiple Vitamin (MULTIVITAMIN) tablet Take 1 tablet by mouth daily.     Omeprazole 20 MG TBEC Take 20 mg by mouth daily.   0   Polyethyl Glycol-Propyl Glycol (SYSTANE) 0.4-0.3 % SOLN Apply 1 drop to eye 2 (two) times daily as needed (dry eyes).      rosuvastatin (CRESTOR) 10 MG tablet Take 10 mg by mouth every Monday, Wednesday, and Friday.      spironolactone (ALDACTONE) 25 MG tablet Take 25 mg by mouth daily.     No current facility-administered medications for this visit.    Allergies  Allergen Reactions   Codeine Nausea And Vomiting   Ivp Dye [Iodinated Contrast  Media] Rash    REVIEW OF SYSTEMS (negative unless checked):   Cardiac:  []  Chest pain or chest pressure? []  Shortness of breath upon activity? []  Shortness of breath when lying flat? []  Irregular heart rhythm?  Vascular:  []  Pain in calf, thigh, or hip brought on by walking? []  Pain in feet at night that wakes you up from your sleep? []  Blood clot in your veins? []  Leg swelling?  Pulmonary:  []  Oxygen at home? []  Productive cough? []  Wheezing?  Neurologic:  []  Sudden weakness in arms or legs? []  Sudden numbness in arms or legs? []  Sudden onset of difficult speaking or slurred speech? []  Temporary loss of vision in one eye? []  Problems with dizziness?  Gastrointestinal:  []  Blood in stool? []  Vomited blood?  Genitourinary:  []  Burning when urinating? []  Blood in urine?  Psychiatric:  []  Major depression  Hematologic:  []  Bleeding problems? []  Problems with blood clotting?  Dermatologic:  []  Rashes or ulcers?  Constitutional:  []  Fever or chills?  Ear/Nose/Throat:  []  Change in hearing? []  Nose bleeds? []  Sore throat?  Musculoskeletal:  []  Back pain? []  Joint pain? []  Muscle pain?   Physical Examination     Vitals:   01/05/24 0856  BP: (!) 158/85  Pulse: (!) 52  Temp: 98 F (36.7 C)  TempSrc: Temporal  SpO2: 99%  Weight: 227 lb 1.6 oz (103 kg)  Height: 6\' 4"  (1.93 m)   Body mass index is 27.64 kg/m.  General:  WDWN in NAD; vital signs documented above Gait: Not observed HENT: WNL, normocephalic Pulmonary: normal non-labored breathing , without Rales, rhonchi,  wheezing Cardiac: regular Abdomen: soft, NT, no masses Skin: without rashes Vascular Exam/Pulses: BLE warm and well perfused Extremities: No edema of bilateral lower extremities.  Several small varicosities around the right ankle and foot Musculoskeletal: no muscle wasting or atrophy  Neurologic: A&O X 3;  No focal weakness or paresthesias are detected Psychiatric:  The pt  has Normal affect.  Non-invasive Vascular Imaging  Left IVC/Iliac Vein Duplex (01/05/2024):  Widely patent IVC and left iliac vein stenting without stenosis or thrombus   Medical Decision Making   Don Massey is a 68 y.o. male who presents for follow up  The patient has a history of extensive left lower extremity DVT, ultimately requiring left iliac vein stenting Duplex today demonstrates a patent IVC and left common and external iliac veins.  There is no stenosis of his left iliac vein stent He remains asymptomatic after intervention.  He denies any pain or swelling in his lower extremities.  He is compliant with compression therapy On exam he has no edema of bilateral lower extremities.  His feet are warm and well-perfused. He will continue his current medications  and follow-up with our office in 1 year for repeat IVC and left iliac vein duplex   Ernestene Mention, PA-C [facility] Office: [phone]  01/05/2024, 8:59 AM  Clinic MD: Randie Heinz

## 2024-04-12 ENCOUNTER — Emergency Department (HOSPITAL_BASED_OUTPATIENT_CLINIC_OR_DEPARTMENT_OTHER)
Admission: EM | Admit: 2024-04-12 | Discharge: 2024-04-12 | Disposition: A | Discharge status: Home / Self Care | Attending: Emergency Medicine | Admitting: Emergency Medicine

## 2024-04-12 ENCOUNTER — Emergency Department (HOSPITAL_BASED_OUTPATIENT_CLINIC_OR_DEPARTMENT_OTHER)

## 2024-04-12 ENCOUNTER — Other Ambulatory Visit: Payer: Self-pay

## 2024-04-12 ENCOUNTER — Encounter (HOSPITAL_BASED_OUTPATIENT_CLINIC_OR_DEPARTMENT_OTHER): Payer: Self-pay

## 2024-04-12 DIAGNOSIS — R202 Paresthesia of skin: Secondary | ICD-10-CM | POA: Insufficient documentation

## 2024-04-12 DIAGNOSIS — R200 Anesthesia of skin: Secondary | ICD-10-CM | POA: Insufficient documentation

## 2024-04-12 DIAGNOSIS — Z7982 Long term (current) use of aspirin: Secondary | ICD-10-CM | POA: Insufficient documentation

## 2024-04-12 DIAGNOSIS — I10 Essential (primary) hypertension: Secondary | ICD-10-CM | POA: Insufficient documentation

## 2024-04-12 DIAGNOSIS — R0789 Other chest pain: Secondary | ICD-10-CM | POA: Diagnosis present

## 2024-04-12 DIAGNOSIS — M7989 Other specified soft tissue disorders: Secondary | ICD-10-CM | POA: Diagnosis not present

## 2024-04-12 DIAGNOSIS — Z79899 Other long term (current) drug therapy: Secondary | ICD-10-CM | POA: Diagnosis not present

## 2024-04-12 LAB — CBC WITH DIFFERENTIAL/PLATELET
Abs Immature Granulocytes: 0.02 10*3/uL (ref 0.00–0.07)
Basophils Absolute: 0 10*3/uL (ref 0.0–0.1)
Basophils Relative: 0 %
Eosinophils Absolute: 0.1 10*3/uL (ref 0.0–0.5)
Eosinophils Relative: 1 %
HCT: 41.9 % (ref 39.0–52.0)
Hemoglobin: 14.4 g/dL (ref 13.0–17.0)
Immature Granulocytes: 0 %
Lymphocytes Relative: 32 %
Lymphs Abs: 2.6 10*3/uL (ref 0.7–4.0)
MCH: 29.9 pg (ref 26.0–34.0)
MCHC: 34.4 g/dL (ref 30.0–36.0)
MCV: 86.9 fL (ref 80.0–100.0)
Monocytes Absolute: 0.6 10*3/uL (ref 0.1–1.0)
Monocytes Relative: 8 %
Neutro Abs: 4.7 10*3/uL (ref 1.7–7.7)
Neutrophils Relative %: 59 %
Platelets: 201 10*3/uL (ref 150–400)
RBC: 4.82 MIL/uL (ref 4.22–5.81)
RDW: 13.6 % (ref 11.5–15.5)
WBC: 8 10*3/uL (ref 4.0–10.5)
nRBC: 0 % (ref 0.0–0.2)

## 2024-04-12 LAB — TROPONIN T, HIGH SENSITIVITY
Troponin T High Sensitivity: <15 ng/L (ref <19)
Troponin T High Sensitivity: <15 ng/L (ref <19)
Troponin T High Sensitivity: <15 ng/L (ref <19)

## 2024-04-12 LAB — COMPREHENSIVE METABOLIC PANEL WITH GFR
ALT: 16 U/L (ref 0–44)
AST: 22 U/L (ref 15–41)
Albumin: 4.5 g/dL (ref 3.5–5.0)
Alkaline Phosphatase: 84 U/L (ref 38–126)
Anion gap: 11 (ref 5–15)
BUN: 15 mg/dL (ref 8–23)
CO2: 25 mmol/L (ref 22–32)
Calcium: 9.7 mg/dL (ref 8.9–10.3)
Chloride: 98 mmol/L (ref 98–111)
Creatinine, Ser: 0.98 mg/dL (ref 0.61–1.24)
GFR, Estimated: >60 mL/min (ref >60)
Glucose, Bld: 106 mg/dL — ABNORMAL HIGH (ref 70–99)
Potassium: 4.1 mmol/L (ref 3.5–5.1)
Sodium: 135 mmol/L (ref 135–145)
Total Bilirubin: 0.6 mg/dL (ref 0.0–1.2)
Total Protein: 7.2 g/dL (ref 6.5–8.1)

## 2024-04-12 LAB — MAGNESIUM: Magnesium: 2.3 mg/dL (ref 1.7–2.4)

## 2024-04-12 LAB — D-DIMER, QUANTITATIVE: D-Dimer, Quant: 0.87 ug{FEU}/mL — ABNORMAL HIGH (ref 0.00–0.50)

## 2024-04-12 MED ORDER — METHYLPREDNISOLONE SODIUM SUCC 40 MG IJ SOLR
40.0000 mg | Freq: Once | INTRAMUSCULAR | Status: AC
  Administered 2024-04-12: 40 mg via INTRAVENOUS
  Filled 2024-04-12: qty 1

## 2024-04-12 MED ORDER — IOHEXOL 350 MG/ML SOLN
150.0000 mL | Freq: Once | INTRAVENOUS | Status: AC | PRN
  Administered 2024-04-12: 150 mL via INTRAVENOUS

## 2024-04-12 MED ORDER — DIPHENHYDRAMINE HCL 25 MG PO CAPS: 50.0000 mg | ORAL_CAPSULE | Freq: Once | ORAL | Status: AC

## 2024-04-12 MED ORDER — DIPHENHYDRAMINE HCL 50 MG/ML IJ SOLN
50.0000 mg | Freq: Once | INTRAMUSCULAR | Status: AC
  Administered 2024-04-12: 50 mg via INTRAVENOUS
  Filled 2024-04-12: qty 1

## 2024-04-12 NOTE — ED Triage Notes (Addendum)
 Pt reports tingling in his feet and lower legs on and off today- better when standing. Pt describes a jumping and anxious feeling in his chest. Pt spouse reporting he knocked his right ankle last week and he had a lot of bleeding from the area, no further bleeding since. Denies SOB, dizziness, back, abdominal pain or swelling. States hx of May Thurner syndrome

## 2024-04-12 NOTE — ED Provider Notes (Signed)
  EMERGENCY DEPARTMENT AT [facility] Provider Note   CSN: [id number] Arrival date & time: 04/12/24  0102     Patient presents with: Chest Pain   Don Massey is a 68 y.o. male.   Patient with a history of DVT, May Thurner syndrome, hyperlipidemia hypertension presenting with tingling to his bilateral ankles and feet.  Symptoms have been ongoing since approximately 12 noon.  Comes and goes lasting several minutes to hours at a time.  No weakness.  No significant leg pain.  No back pain.  No abdominal pain.  He is concerned he could have another blood clot.  He takes aspirin but no other blood thinners currently.  Did have stent placed in his IVC and iliac vein. En route to the hospital today he developed some jumping in his chest that he states is not pain and it is not pressure but feels anxiety and discomfort in his chest that has been constant for the past several hours.  Denies any radiation of the jumping feeling to his arms, neck or back.  No associated shortness of breath, cough or fever.  No nausea or vomiting.  No diaphoresis.  No fever.  No blood thinner use other than aspirin.  Denies any bowel or bladder incontinence.  No focal weakness.  No history of IV drug abuse or cancer.  The history is provided by the patient.  Chest Pain Associated symptoms: numbness   Associated symptoms: no abdominal pain, no fatigue, no fever, no nausea, no shortness of breath and no vomiting        Prior to Admission medications   Medication Sig Start Date End Date Taking? Authorizing Provider  olmesartan (BENICAR) 20 MG tablet Take 20 mg by mouth daily. 03/27/24  Yes [provider]  aspirin EC 81 MG tablet Take 81 mg by mouth daily. Swallow whole.    [provider]  Multiple Vitamin (MULTIVITAMIN) tablet Take 1 tablet by mouth daily.    [provider]  Omeprazole 20 MG TBEC Take 20 mg by mouth daily.  11/22/15   [provider]   Polyethyl Glycol-Propyl Glycol (SYSTANE) 0.4-0.3 % SOLN Apply 1 drop to eye 2 (two) times daily as needed (dry eyes).     [provider]  rosuvastatin (CRESTOR) 10 MG tablet Take 10 mg by mouth every Monday, Wednesday, and Friday.     [provider]  spironolactone (ALDACTONE) 25 MG tablet Take 25 mg by mouth daily. 10/15/22   [provider]    Allergies: Codeine and Ivp dye [iodinated contrast media]    Review of Systems  Constitutional:  Negative for activity change, appetite change, fatigue and fever.  HENT:  Negative for congestion and rhinorrhea.   Respiratory:  Positive for chest tightness. Negative for shortness of breath.   Cardiovascular:  Negative for chest pain.  Gastrointestinal:  Negative for abdominal pain, nausea and vomiting.  Genitourinary:  Negative for dysuria.  Musculoskeletal:  Positive for arthralgias and myalgias.  Skin:  Negative for rash.  Neurological:  Positive for numbness.   all other systems are negative except as noted in the HPI and PMH.    Updated Vital Signs BP (!) 164/84 (BP Location: Right Arm)   Pulse 73   Temp 98.1 F (36.7 C) (Oral)   Resp 14   Ht 6' 4 (1.93 m)   Wt 102.1 kg   SpO2 100%   BMI 27.39 kg/m   Physical Exam Vitals and nursing note reviewed.  Constitutional:      General: He is not in acute distress.    Appearance: He is well-developed. He is not ill-appearing.  HENT:     Head: Normocephalic and atraumatic.     Mouth/Throat:     Pharynx: No oropharyngeal exudate.   Eyes:     Conjunctiva/sclera: Conjunctivae normal.     Pupils: Pupils are equal, round, and reactive to light.   Neck:     Comments: No meningismus. Cardiovascular:     Rate and Rhythm: Normal rate and regular rhythm.     Heart sounds: Normal heart sounds. No murmur heard. Pulmonary:     Effort: Pulmonary effort is normal. No respiratory distress.     Breath sounds: Normal breath sounds.  Chest:     Chest wall: No  tenderness.  Abdominal:     Palpations: Abdomen is soft.     Tenderness: There is no abdominal tenderness. There is no guarding or rebound.   Musculoskeletal:        General: No tenderness. Normal range of motion.     Cervical back: Normal range of motion and neck supple.     Comments: PT and DP pulses bilaterally. No calf asymmetry or swelling.  5/5 strength in bilateral lower extremities. Ankle plantar and dorsiflexion intact. Great toe extension intact bilaterally. +2 DP and PT pulses. +2 patellar reflexes bilaterally. Normal gait.    Skin:    General: Skin is warm.   Neurological:     Mental Status: He is alert and oriented to person, place, and time.     Cranial Nerves: No cranial nerve deficit.     Motor: No abnormal muscle tone.     Coordination: Coordination normal.     Comments:  5/5 strength throughout. CN 2-12 intact.Equal grip strength.   Psychiatric:        Behavior: Behavior normal.     (all labs ordered are listed, but only abnormal results are displayed) Labs Reviewed  COMPREHENSIVE METABOLIC PANEL WITH GFR - Abnormal; Notable for the following components:      Result Value   Glucose, Bld 106 (*)    All other components within normal limits  D-DIMER, QUANTITATIVE - Abnormal; Notable for the following components:   D-Dimer, Quant 0.87 (*)    All other components within normal limits  CBC WITH DIFFERENTIAL/PLATELET  MAGNESIUM  TROPONIN T, HIGH SENSITIVITY  TROPONIN T, HIGH SENSITIVITY  TROPONIN T, HIGH SENSITIVITY    EKG: EKG Interpretation Date/Time:  Wednesday April 12 2024 01:11:23 EDT Ventricular Rate:  79 PR Interval:  197 QRS Duration:  102 QT Interval:  403 QTC Calculation: 462 R Axis:   82  Text Interpretation: Sinus rhythm Borderline right axis deviation Borderline repolarization abnormality lateral ST depression Confirmed by Carita Senior [id number]) on 04/12/2024 1:14:53 AM  Radiology: [facility] Chest Portable 1 View Result Date:  04/12/2024 CLINICAL DATA:  Chest pain EXAM: PORTABLE CHEST 1 VIEW COMPARISON:  None Available. FINDINGS: The heart size and mediastinal contours are within normal limits. Both lungs are clear. The visualized skeletal structures are unremarkable. IMPRESSION: No active disease. Electronically Signed   By: Dorethia Molt M.D.   On: 04/12/2024 01:48     Procedures   Medications Ordered in the ED  methylPREDNISolone sodium succinate  (SOLU-MEDROL) 40 mg/mL injection 40 mg (has no administration in time range)  diphenhydrAMINE  (BENADRYL ) capsule 50 mg (has no administration in time range)    Or  diphenhydrAMINE  (BENADRYL ) injection 50 mg (has no administration in time range)  Medical Decision Making Amount and/or Complexity of Data Reviewed Labs: ordered. Decision-making details documented in ED Course. Radiology: ordered and independent interpretation performed. Decision-making details documented in ED Course. ECG/medicine tests: ordered and independent interpretation performed. Decision-making details documented in ED Course.  Risk Prescription drug management.   Patient with history of May Thurner syndrome here with tingling to his bilateral feet and ankles since noon.  Intermittent jumping in his chest.  Intact distal pulses.  Intact distal strength, sensation, pulses and reflexes with low concern for cord compression or cauda equina  Initial EKG with subtle ST depressions laterally which are not seen on repeat EKG. Suspect artifactual in nature.   Denies chest pain or pressure but reports jumping in his chest.  Intact distal pulses.  Intact distal strength. Given his history will obtain imaging to evaluate for aortic aneurysm or dissection or vascular occlusion.  He does have a IV contrast allergy of rash but states he has tolerated pretreatment in the past denies any difficulty breathing, tongue swelling or lip swelling with IV  contrast.  Troponin remains negative.  Repeat EKG shows normal sinus rhythm without acute ST changes. D-dimer 0.87. His description of chest discomfort is not typical of ACS or PE. However given his clotting history as well as nonspecific tingling in his legs will rule out aortic dissection and pulmonary embolism.  Bilateral LE dopplers are negative for DVT.  CTA pending at shift change. Given discomfort in chest with tingling in feet, CTA with run off to be obtained to evaluate for aortic dissection or pulmonary embolism.   Anticipate discussion with vascular surgery after CT results. May require additional imaging to assess patency of venous system including IVC and iliac arteries. Unclear etiology of tingling in feet but will ensure no vascular occlusion. Electrolytes are reassuring.   Care transferred to at shift change to Dr. Ginger pending CTA and discussion with vascular surgery.      Final diagnoses:  None    ED Discharge Orders     None          Rhiley Solem, Garnette, MD 04/12/24 [phone]

## 2024-04-12 NOTE — Discharge Instructions (Signed)
 Your history, exam, workup today did not reveal a clear arterial or venous cause of your leg tingling symptoms and your thoracic workup and imaging was otherwise reassuring.  I spoke to vascular surgery on-call who recommended follow-up with your primary team and did not feel you needed further imaging or workup today.  Please rest and stay hydrated.  If any symptoms change or worsen acutely, please return to the nearest emergency department

## 2024-04-12 NOTE — ED Provider Notes (Signed)
 Care assumed from Dr. Carita.  At time of transfer of care, patient awaiting on results of CTA to look for evidence of arterial clot.  Anticipate reassessment of patient and likely discussion with vascular surgery to discuss if any further workup or imaging is needed versus outpatient follow-up.  8:50 AM CT scan returned showing no large aortic aneurysm, dissection, or aortic problem other than tortuosity.  No large PE seen however the images were primarily arterial.  They see the venous stenting but cannot comment on patency however the bilateral DVT ultrasounds were negative today.  There was a small distal posterior tibial artery occlusion that had good flow distally that I suspect is more chronic as he is not having significant pain at location and has the distal flow.  As the images were unable to determine the patency of the stent, the plan was to call vascular surgery to discuss.  Will await vascular recommendations if the patient needs further imaging or if he is now safe for discharge home for close vascular follow-up.  9:51 AM Just spoke to Dr. Serene who reviewed the images and case and they feel the patient does not need further vascular workup at this time and is safe for continued outpatient follow-up plan.  Will discuss with patient and anticipate discharge.  10:00 AM Spoke with patient who agrees to plan for discharge home and close follow-up.  Initial return precautions and follow-up instructions and was discharged in good condition with reassuring vital signs.  Clinical Impression: 1. Paresthesia   2. Atypical chest pain     Disposition: Discharge  Condition: Good  I have discussed the results, Dx and Tx plan with the pt(& family if present). He/she/they expressed understanding and agree(s) with the plan. Discharge instructions discussed at great length. Strict return precautions discussed and pt &/or family have verbalized understanding of the instructions. No further  questions at time of discharge.    New Prescriptions   No medications on file    Follow Up: Leonel Cole, MD [address] [place] [address] [phone]  Schedule an appointment as soon as possible for a visit in 2 days   Sheree Penne Bruckner, MD [address] [place] [id number] [phone]  Schedule an appointment as soon as possible for a visit in 2 days      Abygayle Deltoro, Bruckner PARAS, MD 04/12/24 1001

## 2024-07-05 ENCOUNTER — Ambulatory Visit: Attending: Vascular Surgery

## 2024-07-05 DIAGNOSIS — I83891 Varicose veins of right lower extremities with other complications: Secondary | ICD-10-CM

## 2024-07-05 NOTE — Progress Notes (Signed)
 Pt's RLE spider and small reticular veins treated with Asclera 1%. Pt received a total of 2 mL/20 mg of Asclera 1% administered with a 27 gauge butterfly needle. Pt tolerated well. Easy access. He has had a bleed from one of these areas in the past and we discussed how to manage that at home if it occurs again. He is aware to call us  should it happen again and we can do more sclerotherapy. He was placed in 20-30 mm Hg knee high compression stocking (size large). He was given post treatment care instructions on handout and verbally.

## 2024-07-31 DIAGNOSIS — I10 Essential (primary) hypertension: Secondary | ICD-10-CM | POA: Diagnosis not present

## 2024-07-31 DIAGNOSIS — K219 Gastro-esophageal reflux disease without esophagitis: Secondary | ICD-10-CM | POA: Diagnosis not present

## 2024-07-31 DIAGNOSIS — R7301 Impaired fasting glucose: Secondary | ICD-10-CM | POA: Diagnosis not present

## 2024-07-31 DIAGNOSIS — E7800 Pure hypercholesterolemia, unspecified: Secondary | ICD-10-CM | POA: Diagnosis not present

## 2024-08-02 DIAGNOSIS — I10 Essential (primary) hypertension: Secondary | ICD-10-CM | POA: Diagnosis not present

## 2024-08-02 DIAGNOSIS — Z23 Encounter for immunization: Secondary | ICD-10-CM | POA: Diagnosis not present

## 2024-08-02 DIAGNOSIS — K219 Gastro-esophageal reflux disease without esophagitis: Secondary | ICD-10-CM | POA: Diagnosis not present

## 2024-08-02 DIAGNOSIS — E7800 Pure hypercholesterolemia, unspecified: Secondary | ICD-10-CM | POA: Diagnosis not present

## 2024-08-02 DIAGNOSIS — I871 Compression of vein: Secondary | ICD-10-CM | POA: Diagnosis not present

## 2024-08-02 DIAGNOSIS — R7309 Other abnormal glucose: Secondary | ICD-10-CM | POA: Diagnosis not present

## 2024-08-31 DIAGNOSIS — R051 Acute cough: Secondary | ICD-10-CM | POA: Diagnosis not present

## 2024-08-31 DIAGNOSIS — J40 Bronchitis, not specified as acute or chronic: Secondary | ICD-10-CM | POA: Diagnosis not present

## 2024-08-31 DIAGNOSIS — R0981 Nasal congestion: Secondary | ICD-10-CM | POA: Diagnosis not present

## 2024-09-13 ENCOUNTER — Ambulatory Visit: Admitting: Orthopaedic Surgery

## 2024-09-13 ENCOUNTER — Other Ambulatory Visit (INDEPENDENT_AMBULATORY_CARE_PROVIDER_SITE_OTHER): Payer: Self-pay

## 2024-09-13 ENCOUNTER — Encounter: Payer: Self-pay | Admitting: Orthopaedic Surgery

## 2024-09-13 DIAGNOSIS — M25512 Pain in left shoulder: Secondary | ICD-10-CM

## 2024-09-13 DIAGNOSIS — G8929 Other chronic pain: Secondary | ICD-10-CM

## 2024-09-13 DIAGNOSIS — M7541 Impingement syndrome of right shoulder: Secondary | ICD-10-CM

## 2024-09-13 DIAGNOSIS — M25511 Pain in right shoulder: Secondary | ICD-10-CM | POA: Diagnosis not present

## 2024-09-13 DIAGNOSIS — M7542 Impingement syndrome of left shoulder: Secondary | ICD-10-CM | POA: Diagnosis not present

## 2024-09-13 MED ORDER — METHYLPREDNISOLONE ACETATE 40 MG/ML IJ SUSP
40.0000 mg | INTRAMUSCULAR | Status: AC | PRN
  Administered 2024-09-13: 40 mg via INTRA_ARTICULAR

## 2024-09-13 MED ORDER — LIDOCAINE HCL 1 % IJ SOLN
3.0000 mL | INTRAMUSCULAR | Status: AC | PRN
  Administered 2024-09-13: 3 mL

## 2024-09-13 NOTE — Progress Notes (Signed)
 The patient is a 68 year old established patient of ours but we have not seen him for a while so he was listed as new patient.  He has been having bilateral shoulder pain with mostly range of motion lifting above his head for some time now but it is bothering him sleeping for years.  However it has been worsening during the day over the last 6 months with no known injury.  He never had injections in his shoulders or any type of surgery.  He tries to stay as active as possible.  He rarely takes anti-inflammatories.  On exam he does show some decreased motion of both shoulders especially left shoulder.  He is using more of his deltoid to abduct the shoulder on the left side.  There is positive Neer and Hawkins signs of both shoulders and positive empty can test.  Reaching behind him with internal rotation and adduction he is only able to reach the lower lumbar spine without significant discomfort.  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  The humeral heads are not high riding.  I would like to send him to outpatient physical therapy to see if this will improve his shoulder motion and strength and decrease some of his pain.  I did recommend a subacromial injection in both shoulders which he agreed to and tolerated well.  He can try some Aleve  with a snack about an hour before bedtime at night to see if this will help.  We will then see him back in 6 weeks and determine whether or not we would send him at that point for intra-articular steroid injections under ultrasound.  He agrees with this treatment plan.    Procedure Note  Patient: Don Massey             Date of Birth: 1956/08/28           MRN: [id number]             Visit Date: 09/13/2024  Procedures: Visit Diagnoses:  1. Chronic left shoulder pain   2. Chronic right shoulder pain   3. Impingement syndrome of left shoulder   4. Impingement syndrome of right shoulder     Large Joint Inj: R  subacromial bursa on 09/13/2024 9:25 AM Indications: pain and diagnostic evaluation Details: 22 G 1.5 in needle  Arthrogram: No  Medications: 3 mL lidocaine  1 %; 40 mg methylPREDNISolone  acetate 40 MG/ML Outcome: tolerated well, no immediate complications Procedure, treatment alternatives, risks and benefits explained, specific risks discussed. Consent was given by the patient. Immediately prior to procedure a time out was called to verify the correct patient, procedure, equipment, support staff and site/side marked as required. Patient was prepped and draped in the usual sterile fashion.    Large Joint Inj: L subacromial bursa on 09/13/2024 9:25 AM Indications: pain and diagnostic evaluation Details: 22 G 1.5 in needle  Arthrogram: No  Medications: 3 mL lidocaine  1 %; 40 mg methylPREDNISolone  acetate 40 MG/ML Outcome: tolerated well, no immediate complications Procedure, treatment alternatives, risks and benefits explained, specific risks discussed. Consent was given by the patient. Immediately prior to procedure a time out was called to verify the correct patient, procedure, equipment, support staff and site/side marked as required. Patient was prepped and draped in the usual sterile fashion.

## 2024-09-27 ENCOUNTER — Encounter: Payer: Self-pay | Admitting: Orthopaedic Surgery

## 2024-09-27 ENCOUNTER — Other Ambulatory Visit: Payer: Self-pay

## 2024-09-27 DIAGNOSIS — M7542 Impingement syndrome of left shoulder: Secondary | ICD-10-CM

## 2024-09-27 DIAGNOSIS — G8929 Other chronic pain: Secondary | ICD-10-CM

## 2024-09-27 DIAGNOSIS — M7541 Impingement syndrome of right shoulder: Secondary | ICD-10-CM

## 2024-10-23 ENCOUNTER — Ambulatory Visit

## 2024-10-23 DIAGNOSIS — M25512 Pain in left shoulder: Secondary | ICD-10-CM

## 2024-10-23 DIAGNOSIS — M25612 Stiffness of left shoulder, not elsewhere classified: Secondary | ICD-10-CM

## 2024-10-23 DIAGNOSIS — M25611 Stiffness of right shoulder, not elsewhere classified: Secondary | ICD-10-CM | POA: Diagnosis not present

## 2024-10-23 DIAGNOSIS — G8929 Other chronic pain: Secondary | ICD-10-CM

## 2024-10-23 DIAGNOSIS — M25511 Pain in right shoulder: Secondary | ICD-10-CM | POA: Diagnosis not present

## 2024-10-23 DIAGNOSIS — M6281 Muscle weakness (generalized): Secondary | ICD-10-CM

## 2024-10-23 DIAGNOSIS — R293 Abnormal posture: Secondary | ICD-10-CM

## 2024-10-23 NOTE — Therapy (Signed)
 " OUTPATIENT PHYSICAL THERAPY UPPER EXTREMITY EVALUATION   Patient Name: Don Massey MRN: [id number] DOB:[birth year], 69 y.o., male Today's Date: 10/23/2024  END OF SESSION:  PT End of Session - 10/23/24 0931     Visit Number 1    Number of Visits 12    Date for Recertification  12/04/24    Authorization Type [employer] MEDICARE    Progress Note Due on Visit 10    PT Start Time 0932    PT Stop Time 1012    PT Time Calculation (min) 40 min    Activity Tolerance Patient tolerated treatment well    Behavior During Therapy [name] for tasks assessed/performed          Past Medical History:  Diagnosis Date   Cataracts, bilateral    DVT (deep venous thrombosis) (HCC)    GERD (gastroesophageal reflux disease)    Hyperlipidemia    Peripheral vascular disease    Thrombophlebitis 07/2018   Bilateral legs superficial   Past Surgical History:  Procedure Laterality Date   CATARACT EXTRACTION     CORONARY ULTRASOUND/IVUS Bilateral 08/14/2020   Procedure: Intravascular Ultrasound/IVUS;  Surgeon: Gretta Lonni PARAS, MD;  Location: [facility];  Service: Cardiovascular;  Laterality: Bilateral;  lower legs   ENDOVASCULAR STENT INSERTION  09/05/2020   Procedure: ENDOVASCULAR STENT GRAFT INSERTION INTO INFERIOR VENA CAVA;  Surgeon: Sheree Penne Lonni, MD;  Location: [facility] OR;  Service: Vascular;;   HAND SURGERY Right    Cyst removal   LOWER EXTREMITY VENOGRAPHY Bilateral 08/14/2020   Procedure: LOWER EXTREMITY VENOGRAPHY;  Surgeon: Gretta Lonni PARAS, MD;  Location: [facility] INV[facility] CV [facility];  Service: Cardiovascular;  Laterality: Bilateral;   skin grafts     Legs and arms   UMBILICAL HERNIA REPAIR     UMBILICAL HERNIA REPAIR N/A 05/21/2020   Procedure: LAPAROSCOPIC ASSISTED RECURRENT UMBILICAL HERNIA REPAIR WITH MESH;  Surgeon: Gladis Cough, MD;  Location: [facility] ORS;  Service: General;  Laterality: N/A;   VENOGRAM Left 09/05/2020   Procedure: LEFT LOWER EXTREMITY VENOGRAM, RIGHT INTERNAL  JUGULAR APPROACH;  Surgeon: Sheree Penne Lonni, MD;  Location: [facility] OR;  Service: Vascular;  Laterality: Left;   WISDOM TOOTH EXTRACTION     There are no active problems to display for this patient.   PCP: Cheryle Frees, MD  REFERRING PROVIDER: Lonni CINDERELLA Poli, MD   REFERRING DIAG: [id number] (ICD-10-CM) - Chronic left shoulder pain M25.511,G89.29 (ICD-10-CM) - Chronic right shoulder pain M75.42 (ICD-10-CM) - Impingement syndrome of left shoulder M75.41 (ICD-10-CM) - Impingement syndrome of right shoulder  THERAPY DIAG:  Chronic pain of both shoulders  Stiffness of left shoulder, not elsewhere classified  Stiffness of right shoulder, not elsewhere classified  Abnormal posture  Muscle weakness (generalized)  Rationale for Evaluation and Treatment: Rehabilitation  ONSET DATE: at least 1 year   SUBJECTIVE:  SUBJECTIVE STATEMENT: The diagnosis is arthritis.  Hand dominance: Right  PERTINENT HISTORY: Patient reports pain in Lt>Rt shoulder pain that has been chronic ongoing pain. Patient reports that pain started during sleep about a year ago which then started to hurt throughout the day 6-9 months ago. Patient endorses receiving cortisone shots in bilateral shoulders that has not provided relief and returns to MD on 10/25/2024 for another round of injections. No other traumas, surgeries, or injuries noted in the area or surrounding joints. Patient reports ruptured disc in low back as well as history of blood clots and taking daily aspirin.   See PMH or personal factors for in depth comorbidities   PAIN:  Are you having pain? Yes: NPRS scale: 0/10 at rest, 5-7/10 at worst  Pain location: Lt>Rt, lateral upper arm, generalized shoulder pain  Pain description: sharp, intermittent short  throbbing  Aggravating factors: putting on a jacket, putting on deodorant, any arm mobility (up, out) Relieving factors: not doing the painful movements  PRECAUTIONS: None  RED FLAGS: Bowel or bladder incontinence: No and Cauda equina syndrome: No   WEIGHT BEARING RESTRICTIONS: No  FALLS:  Has patient fallen in last 6 months? No  LIVING ENVIRONMENT: Lives with: lives with their spouse Lives in: House/apartment Stairs: Yes: Internal: 15 steps; on left going up and External: 2 steps; none Has following equipment at home: None  OCCUPATION: Retired, corporate treasurer   PLOF: Independent  PATIENT GOALS: alleviate pain   NEXT MD VISIT: 10/25/2024 with Dr. Vernetta   OBJECTIVE:  Note: Objective measures were completed at Evaluation unless otherwise noted.  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  The humeral head is well located and not high riding   Lt: 3 views of the left shoulder show moderate glenohumeral arthritis and  moderate AC joint arthritis.  The humeral head is well located.   PATIENT SURVEYS :  PSFS: THE PATIENT SPECIFIC FUNCTIONAL SCALE  Place score of 0-10 (0 = unable to perform activity and 10 = able to perform activity at the same level as before injury or problem)  Activity Date: 10/23/2024    Lifting arm above head without pain 5    2. Put on a jacket without pain  5    3. Put on deodorant without pain  5    4.      Total Score 5      Total Score = Sum of activity scores/number of activities  Minimally Detectable Change: 3 points (for single activity); 2 points (for average score)  [facility] (nd). The Patient Specific Functional Scale . Retrieved from [email or web address]   COGNITION: Overall cognitive status: Within functional limits for tasks assessed     SENSATION: Not tested , subjectively negative    POSTURE: Rounded shoulders, increased thoracic kyphosis, forward head   UPPER EXTREMITY ROM: highly painful throughout all motions   Active ROM Right Eval 10/23/2024 Left Eval 10/23/2024  Shoulder flexion (seated) AROM: 125deg , painful PROM: 138deg   AROM: 124deg PROM: 132deg  Shoulder extension    Shoulder abduction (seated) AROM: WFL , painful AROM: WFL, painful   Shoulder adduction    Shoulder internal rotation (seated) T2 T1  Shoulder external rotation (seated) L1 L1  Elbow flexion    Elbow extension    Wrist flexion    Wrist extension    Wrist ulnar deviation    Wrist radial deviation  Wrist pronation    Wrist supination    (Blank rows = not tested)  UPPER EXTREMITY MMT: painful in abduction and flexion more than ER/IR   MMT Right Eval 10/23/2024 Left Eval 10/23/2024  Shoulder flexion (seated) 4/5 (at elbow)   4-/5 (at wrist) 4/5 (at elbow)   4-/5 (at wrist)  Shoulder extension    Shoulder abduction (seated) 4/5 4/5  Shoulder adduction    Shoulder internal rotation (seated) 4+/5 4+/5  Shoulder external rotation (seated) 4-/5 4-/5  Middle trapezius    Lower trapezius    Elbow flexion    Elbow extension    Wrist flexion    Wrist extension    Wrist ulnar deviation    Wrist radial deviation    Wrist pronation    Wrist supination    Grip strength (lbs)    (Blank rows = not tested)  SHOULDER SPECIAL TESTS: Impingement tests: Painful arc test: positive    PALPATION:  Not TTP in bilat bicep, tricep, scapular musculature, deltoids, rhomboids, or UT  Not tender with ACJ compression                                                                                                                              TREATMENT DATE:  10/23/2024 TherEx:  HEP handout and yellow TB provided with patient performing one set of each activity for appropriate form. Verbal and tactile cues provided.   Self-Care:  POC and postural implications with shoulder pain   PATIENT  EDUCATION: Education details: HEP, POC, posture  Person educated: Patient Education method: Explanation, Demonstration, Tactile cues, Verbal cues, and Handouts Education comprehension: verbalized understanding, returned demonstration, verbal cues required, and tactile cues required  HOME EXERCISE PROGRAM: Access Code: [email or web address] URL: [email or web address] Date: 10/23/2024 Prepared by: Susannah Daring  Exercises - Seated Scapular Retraction  - 1 x daily - 7 x weekly - 2 sets - 10 reps - 3-5sec hold - Seated Upper Trapezius Stretch  - 1 x daily - 7 x weekly - 2 sets - Supine Shoulder Flexion Extension AAROM with Dowel  - 1 x daily - 7 x weekly - 2 sets - 5 reps - 5-10sec hold - Shoulder External Rotation and Scapular Retraction with Resistance  - 1 x daily - 7 x weekly - 2 sets - 10 reps - 2-3sec hold  ASSESSMENT:  CLINICAL IMPRESSION: Patient is a 69 y.o. M who was seen today for physical therapy evaluation and treatment for chronic bilat shoulder pain presenting with deficits in motor coordination, strength, functional mobility, ROM, posture, and pain. Patient is mainly limited secondary to pain with functional mobility and ROM. Patient will benefit from skilled PT to address above noted deficits.  OBJECTIVE IMPAIRMENTS: decreased coordination, decreased ROM, decreased strength, impaired flexibility, improper body mechanics, postural dysfunction, and pain.   ACTIVITY LIMITATIONS: carrying, lifting, bed mobility, dressing, reach over head, and hygiene/grooming  PARTICIPATION LIMITATIONS: cleaning, community activity, and yard work  PERSONAL FACTORS: Past/current  experiences, Time since onset of injury/illness/exacerbation, and 3+ comorbidities: GERD, cataracts, HLD, PVD, history of DVT are also affecting patient's functional outcome.   REHAB POTENTIAL: Good  CLINICAL DECISION MAKING: Stable/uncomplicated  EVALUATION COMPLEXITY: Low  GOALS: Goals reviewed with patient?  Yes  SHORT TERM GOALS: Target date: 11/13/2024  Patient will show compliance with initial HEP. Baseline: Goal status: INITIAL  2.  Patient will report pain levels no greater than 5/10 in order to show an improved overall quality of life. Baseline:  Goal status: INITIAL   LONG TERM GOALS: Target date: 12/04/2024  Patient will be independent with final HEP in order to maintain and progress upon functional gains made within PT. Baseline:  Goal status: INITIAL  2.  Patient will report pain levels no greater than 3/10 in order to show an improved overall quality of life. Baseline:  Goal status: INITIAL  3.  Patient will increase PSFS to at least 7 in order to show a significant improvement in subjective disability rating.  Baseline:  Goal status: INITIAL  4.  Patient will improve bilat shoulder flexion AROM to at least 140deg in order to improve functional mobility.  Baseline:  Goal status: INITIAL  5.  Patient will improve bilat shoulder flexion MMT to at least 4+/5 with resistance provided at wrist in order to improve biomechanics with functional mobility. Baseline:  Goal status: INITIAL   PLAN: PT FREQUENCY: 1-2x/week  PT DURATION: 6 weeks  PLANNED INTERVENTIONS: 97164- PT Re-evaluation, 97750- Physical Performance Testing, 97110-Therapeutic exercises, 97530- Therapeutic activity, [id number]- Neuromuscular re-education, 97535- Self Care, 02859- Manual therapy, [id number]- Gait training, [phone]- Orthotic Initial, [id number]- Orthotic/Prosthetic subsequent, [phone]- Canalith repositioning, [id number]- Aquatic Therapy, [phone]- Electrical stimulation (unattended), [phone]- Electrical stimulation (manual), [id number]- Vasopneumatic device, [id number]- Ultrasound, [id number]- Traction (mechanical), [id number]- Ionotophoresis 4mg /ml Dexamethasone , 79439 (1-2 muscles), 20561 (3+ muscles)- Dry Needling, Patient/Family education, Balance training, Stair training, Taping, Joint mobilization, Joint manipulation, Spinal manipulation,  Spinal mobilization, Vestibular training, DME instructions, Cryotherapy, and Moist heat  PLAN FOR NEXT SESSION: review and add to HEP, shoulder mobility, shoulder strengthening, postural strengthening    Susannah Daring, PT, DPT 10/23/2024 12:17 PM    "

## 2024-10-25 ENCOUNTER — Ambulatory Visit: Admitting: Sports Medicine

## 2024-10-25 ENCOUNTER — Encounter: Payer: Self-pay | Admitting: Orthopaedic Surgery

## 2024-10-25 ENCOUNTER — Encounter: Payer: Self-pay | Admitting: Sports Medicine

## 2024-10-25 ENCOUNTER — Other Ambulatory Visit: Payer: Self-pay

## 2024-10-25 ENCOUNTER — Ambulatory Visit: Admitting: Orthopaedic Surgery

## 2024-10-25 DIAGNOSIS — M25512 Pain in left shoulder: Secondary | ICD-10-CM

## 2024-10-25 DIAGNOSIS — M25511 Pain in right shoulder: Secondary | ICD-10-CM | POA: Diagnosis not present

## 2024-10-25 DIAGNOSIS — G8929 Other chronic pain: Secondary | ICD-10-CM

## 2024-10-25 DIAGNOSIS — M19012 Primary osteoarthritis, left shoulder: Secondary | ICD-10-CM

## 2024-10-25 DIAGNOSIS — M19011 Primary osteoarthritis, right shoulder: Secondary | ICD-10-CM | POA: Diagnosis not present

## 2024-10-25 MED ORDER — BUPIVACAINE HCL 0.25 % IJ SOLN
2.0000 mL | INTRAMUSCULAR | Status: AC | PRN
  Administered 2024-10-25: 2 mL via INTRA_ARTICULAR

## 2024-10-25 MED ORDER — LIDOCAINE HCL 1 % IJ SOLN
2.0000 mL | INTRAMUSCULAR | Status: AC | PRN
  Administered 2024-10-25: 2 mL

## 2024-10-25 MED ORDER — METHYLPREDNISOLONE ACETATE 40 MG/ML IJ SUSP
60.0000 mg | INTRAMUSCULAR | Status: AC | PRN
  Administered 2024-10-25: 60 mg via INTRA_ARTICULAR

## 2024-10-25 NOTE — Progress Notes (Signed)
 The patient is an active 69 year old gentleman who is being seen in follow-up as a relates to bilateral shoulder pain with the left worse than the right.  There was some delays in getting him set up for physical therapy so he is only started that recently.  His left shoulder has much more limitations in motion than the right shoulder.  His x-rays at last visit showed glenohumeral arthritis of both shoulders with the left worse than the right.  I did place a steroid injection in the subacromial outlet of both shoulders and that was not helpful at all.  On exam he still shows evidence of significant glenohumeral arthritis more on the left than the right.  That affects his abduction and external rotation as well as internal rotation with adduction.  He is not a diabetic.  I would like to see if Dr. Burnetta can see him soon for bilateral intra-articular steroid injections under ultrasound in both left and right shoulder joints.  We can then see him back 6 to 8 weeks later to see how he is doing overall.  Another option would be eventually sending him to one of my partners to consider shoulder arthroplasty.  However the patient appropriately wants to stay as conservative as possible for now which I agree with.

## 2024-10-25 NOTE — Progress Notes (Signed)
" ° °  Procedure Note  Patient: Don Massey             Date of Birth: 1956-09-01           MRN: [id number]             Visit Date: 10/25/2024  Procedures: Visit Diagnoses:  1. Primary osteoarthritis of both shoulders   2. Chronic left shoulder pain   3. Chronic right shoulder pain    Large Joint Inj: L glenohumeral on 10/25/2024 8:50 AM Indications: pain Details: 22 G 3.5 in needle, ultrasound-guided posterior approach Medications: 2 mL lidocaine  1 %; 2 mL bupivacaine  0.25 %; 60 mg methylPREDNISolone  acetate 40 MG/ML Outcome: tolerated well, no immediate complications  US -guided glenohumeral joint injection, left shoulder After discussion on risks/benefits/indications, informed verbal consent was obtained. A timeout was then performed. The patient was positioned lying lateral recumbent on examination table. The patient's shoulder was prepped with betadine and multiple alcohol swabs and utilizing ultrasound guidance, the patient's glenohumeral joint was identified on ultrasound. Using ultrasound guidance a 22-gauge, 3.5 inch needle with a mixture of 2:2:1.5 cc's lidocaine :bupivicaine:depomedrol was directed from a lateral to medial direction via in-plane technique into the glenohumeral joint with visualization of appropriate spread of injectate into the joint. Patient tolerated the procedure well without immediate complications.      Procedure, treatment alternatives, risks and benefits explained, specific risks discussed. Consent was given by the patient. Immediately prior to procedure a time out was called to verify the correct patient, procedure, equipment, support staff and site/side marked as required. Patient was prepped and draped in the usual sterile fashion.    Large Joint Inj: R glenohumeral on 10/25/2024 8:50 AM Indications: pain Details: 22 G 3.5 in needle, ultrasound-guided posterior approach Medications: 2 mL lidocaine  1 %; 2 mL bupivacaine  0.25 %; 60 mg methylPREDNISolone   acetate 40 MG/ML Outcome: tolerated well, no immediate complications  US -guided glenohumeral joint injection, right shoulder After discussion on risks/benefits/indications, informed verbal consent was obtained. A timeout was then performed. The patient was positioned lying lateral recumbent on examination table. The patient's shoulder was prepped with betadine and multiple alcohol swabs and utilizing ultrasound guidance, the patient's glenohumeral joint was identified on ultrasound. Using ultrasound guidance a 22-gauge, 3.5 inch needle with a mixture of 2:2:1.5 cc's lidocaine :bupivicaine:depomedrol was directed from a lateral to medial direction via in-plane technique into the glenohumeral joint with visualization of appropriate spread of injectate into the joint. Patient tolerated the procedure well without immediate complications.      Procedure, treatment alternatives, risks and benefits explained, specific risks discussed. Consent was given by the patient. Immediately prior to procedure a time out was called to verify the correct patient, procedure, equipment, support staff and site/side marked as required. Patient was prepped and draped in the usual sterile fashion.     - patient tolerated procedure well, discussed post-injection protocol - follow-up with Dr. Vernetta x 6  weeks as indicated; I am happy to see them as needed  Lonell Sprang, DO Primary Care Sports Medicine Physician  [facility] - Orthopedics  This note was dictated using Dragon naturally speaking software and may contain errors in syntax, spelling, or content which have not been identified prior to signing this note.      "

## 2024-11-01 ENCOUNTER — Ambulatory Visit: Admitting: Physical Therapy

## 2024-11-01 DIAGNOSIS — M25612 Stiffness of left shoulder, not elsewhere classified: Secondary | ICD-10-CM | POA: Diagnosis not present

## 2024-11-01 DIAGNOSIS — M25611 Stiffness of right shoulder, not elsewhere classified: Secondary | ICD-10-CM

## 2024-11-01 DIAGNOSIS — R293 Abnormal posture: Secondary | ICD-10-CM | POA: Diagnosis not present

## 2024-11-01 DIAGNOSIS — M25511 Pain in right shoulder: Secondary | ICD-10-CM

## 2024-11-01 DIAGNOSIS — M25512 Pain in left shoulder: Secondary | ICD-10-CM | POA: Diagnosis not present

## 2024-11-01 DIAGNOSIS — M6281 Muscle weakness (generalized): Secondary | ICD-10-CM | POA: Diagnosis not present

## 2024-11-01 DIAGNOSIS — G8929 Other chronic pain: Secondary | ICD-10-CM

## 2024-11-01 NOTE — Therapy (Signed)
 " OUTPATIENT PHYSICAL THERAPY UPPER EXTREMITY EVALUATION   Patient Name: Don Massey MRN: [id number] DOB:09/19/1956, 69 y.o., male Today's Date: 11/01/2024  END OF SESSION:    Past Medical History:  Diagnosis Date   Cataracts, bilateral    DVT (deep venous thrombosis) (HCC)    GERD (gastroesophageal reflux disease)    Hyperlipidemia    Peripheral vascular disease    Thrombophlebitis 07/2018   Bilateral legs superficial   Past Surgical History:  Procedure Laterality Date   CATARACT EXTRACTION     CORONARY ULTRASOUND/IVUS Bilateral 08/14/2020   Procedure: Intravascular Ultrasound/IVUS;  Surgeon: Gretta Lonni PARAS, MD;  Location: [facility];  Service: Cardiovascular;  Laterality: Bilateral;  lower legs   ENDOVASCULAR STENT INSERTION  09/05/2020   Procedure: ENDOVASCULAR STENT GRAFT INSERTION INTO INFERIOR VENA CAVA;  Surgeon: Sheree Penne Lonni, MD;  Location: [facility] OR;  Service: Vascular;;   HAND SURGERY Right    Cyst removal   LOWER EXTREMITY VENOGRAPHY Bilateral 08/14/2020   Procedure: LOWER EXTREMITY VENOGRAPHY;  Surgeon: Gretta Lonni PARAS, MD;  Location: [facility];  Service: Cardiovascular;  Laterality: Bilateral;   skin grafts     Legs and arms   UMBILICAL HERNIA REPAIR     UMBILICAL HERNIA REPAIR N/A 05/21/2020   Procedure: LAPAROSCOPIC ASSISTED RECURRENT UMBILICAL HERNIA REPAIR WITH MESH;  Surgeon: Gladis Cough, MD;  Location: [facility];  Service: General;  Laterality: N/A;   VENOGRAM Left 09/05/2020   Procedure: LEFT LOWER EXTREMITY VENOGRAM, RIGHT INTERNAL JUGULAR APPROACH;  Surgeon: Sheree Penne Lonni, MD;  Location: [facility] OR;  Service: Vascular;  Laterality: Left;   WISDOM TOOTH EXTRACTION     There are no active problems to display for this patient.   PCP: Cheryle Frees, MD  REFERRING PROVIDER: Lonni CINDERELLA Poli, MD   REFERRING DIAG: [phone] (ICD-10-CM) - Chronic left shoulder pain M25.511,G89.29 (ICD-10-CM) - Chronic  right shoulder pain M75.42 (ICD-10-CM) - Impingement syndrome of left shoulder M75.41 (ICD-10-CM) - Impingement syndrome of right shoulder  THERAPY DIAG:  No diagnosis found.  Rationale for Evaluation and Treatment: Rehabilitation  ONSET DATE: at least 1 year   SUBJECTIVE:                                                                                                                                                                                      SUBJECTIVE STATEMENT: Pt states he got a shot from Dr. Burnetta. R shoulder is not hurting. L shoulder will hurt up to a 5 at times. Has been doing his HEP -- most discomfort with supine shoulder flexion AAROM using dowel. Hand dominance: Right  PERTINENT HISTORY: Patient reports pain in  Lt>Rt shoulder pain that has been chronic ongoing pain. Patient reports that pain started during sleep about a year ago which then started to hurt throughout the day 6-9 months ago. Patient endorses receiving cortisone shots in bilateral shoulders that has not provided relief and returns to MD on 10/25/2024 for another round of injections. No other traumas, surgeries, or injuries noted in the area or surrounding joints. Patient reports ruptured disc in low back as well as history of blood clots and taking daily aspirin.   See PMH or personal factors for in depth comorbidities   PAIN:  Are you having pain? Yes: NPRS scale: 0/10 at rest, 5-7/10 at worst  Pain location: Lt>Rt, lateral upper arm, generalized shoulder pain  Pain description: sharp, intermittent short throbbing  Aggravating factors: putting on a jacket, putting on deodorant, any arm mobility (up, out) Relieving factors: not doing the painful movements  PRECAUTIONS: None  RED FLAGS: Bowel or bladder incontinence: No and Cauda equina syndrome: No   WEIGHT BEARING RESTRICTIONS: No  FALLS:  Has patient fallen in last 6 months? No  LIVING ENVIRONMENT: Lives with: lives with their spouse Lives  in: House/apartment Stairs: Yes: Internal: 15 steps; on left going up and External: 2 steps; none Has following equipment at home: None  OCCUPATION: Retired, corporate treasurer   PLOF: Independent  PATIENT GOALS: alleviate pain   NEXT MD VISIT: 10/25/2024 with Dr. Vernetta   OBJECTIVE:  Note: Objective measures were completed at Evaluation unless otherwise noted.  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  The humeral head is well located and not high riding   Lt: 3 views of the left shoulder show moderate glenohumeral arthritis and  moderate AC joint arthritis.  The humeral head is well located.   PATIENT SURVEYS :  PSFS: THE PATIENT SPECIFIC FUNCTIONAL SCALE  Place score of 0-10 (0 = unable to perform activity and 10 = able to perform activity at the same level as before injury or problem)  Activity Date: 10/23/2024    Lifting arm above head without pain 5    2. Put on a jacket without pain  5    3. Put on deodorant without pain  5    4.      Total Score 5      Total Score = Sum of activity scores/number of activities  Minimally Detectable Change: 3 points (for single activity); 2 points (for average score)  [facility] (nd). The Patient Specific Functional Scale . Retrieved from [email or web address]   COGNITION: Overall cognitive status: Within functional limits for tasks assessed     SENSATION: Not tested , subjectively negative   POSTURE: Rounded shoulders, increased thoracic kyphosis, forward head   UPPER EXTREMITY ROM: highly painful throughout all motions   Active ROM Right Eval 10/23/2024 Left Eval 10/23/2024  Shoulder flexion (seated) AROM: 125deg , painful PROM: 138deg   AROM: 124deg PROM: 132deg  Shoulder extension    Shoulder abduction (seated) AROM: WFL , painful AROM: WFL, painful   Shoulder adduction     Shoulder internal rotation (seated) T2 T1  Shoulder external rotation (seated) L1 L1  Elbow flexion    Elbow extension    Wrist flexion    Wrist extension    Wrist ulnar deviation    Wrist radial deviation    Wrist pronation    Wrist supination    (Blank rows = not tested)  UPPER  EXTREMITY MMT: painful in abduction and flexion more than ER/IR   MMT Right Eval 10/23/2024 Left Eval 10/23/2024  Shoulder flexion (seated) 4/5 (at elbow)   4-/5 (at wrist) 4/5 (at elbow)   4-/5 (at wrist)  Shoulder extension    Shoulder abduction (seated) 4/5 4/5  Shoulder adduction    Shoulder internal rotation (seated) 4+/5 4+/5  Shoulder external rotation (seated) 4-/5 4-/5  Middle trapezius    Lower trapezius    Elbow flexion    Elbow extension    Wrist flexion    Wrist extension    Wrist ulnar deviation    Wrist radial deviation    Wrist pronation    Wrist supination    Grip strength (lbs)    (Blank rows = not tested)  SHOULDER SPECIAL TESTS: Impingement tests: Painful arc test: positive    PALPATION:  Not TTP in bilat bicep, tricep, scapular musculature, deltoids, rhomboids, or UT  Not tender with ACJ compression                                                                                                                              TREATMENT DATE:  11/01/24 Therex: Seated shoulder pulleys scaption, abduction x2 min each Seated UT stretch x30 R&L Seated scap squeeze 2x10 Seated shoulder ER red TB 2x10 Seated thoracic ext against ball x10 Standing shoulder IR/ER AAROM with dowel up/down back x10  Therapeutic Activity: Standing row green TB 2x10 Standing shoulder ext green TB 2x10 Shoulder flexion AAROM on table 2x10 Shoulder scaption AAROM on table 2x10 Sitting W 2x10   10/23/2024 TherEx:  HEP handout and yellow TB provided with patient performing one set of each activity for appropriate form. Verbal and tactile cues provided.   Self-Care:  POC and postural  implications with shoulder pain   PATIENT EDUCATION: Education details: HEP, POC, posture  Person educated: Patient Education method: Explanation, Demonstration, Tactile cues, Verbal cues, and Handouts Education comprehension: verbalized understanding, returned demonstration, verbal cues required, and tactile cues required  HOME EXERCISE PROGRAM: Access Code: [email or web address] URL: [email or web address] Date: 10/23/2024 Prepared by: Susannah Daring  Exercises - Seated Scapular Retraction  - 1 x daily - 7 x weekly - 2 sets - 10 reps - 3-5sec hold - Seated Upper Trapezius Stretch  - 1 x daily - 7 x weekly - 2 sets - Supine Shoulder Flexion Extension AAROM with Dowel  - 1 x daily - 7 x weekly - 2 sets - 5 reps - 5-10sec hold - Shoulder External Rotation and Scapular Retraction with Resistance  - 1 x daily - 7 x weekly - 2 sets - 10 reps - 2-3sec hold  ASSESSMENT:  CLINICAL IMPRESSION: Reviewed and modified HEP. Pt reports increased pain with supine shoulder flexion using dowel -- modified to table with pt reporting improved comfort. L shoulder ER/IR notable weakness and decreased rotation with W. Continued to progress scapular strengthening and improve thoracic extension for increased overhead range and scapular  coordination for posterior stability. Gets most impingement signs with abd/scaption.  From eval: Patient is a 69 y.o. M who was seen today for physical therapy evaluation and treatment for chronic bilat shoulder pain presenting with deficits in motor coordination, strength, functional mobility, ROM, posture, and pain. Patient is mainly limited secondary to pain with functional mobility and ROM. Patient will benefit from skilled PT to address above noted deficits.  OBJECTIVE IMPAIRMENTS: decreased coordination, decreased ROM, decreased strength, impaired flexibility, improper body mechanics, postural dysfunction, and pain.   ACTIVITY LIMITATIONS: carrying, lifting, bed mobility,  dressing, reach over head, and hygiene/grooming  PARTICIPATION LIMITATIONS: cleaning, community activity, and yard work  PERSONAL FACTORS: Past/current experiences, Time since onset of injury/illness/exacerbation, and 3+ comorbidities: GERD, cataracts, HLD, PVD, history of DVT are also affecting patient's functional outcome.   REHAB POTENTIAL: Good  CLINICAL DECISION MAKING: Stable/uncomplicated  EVALUATION COMPLEXITY: Low  GOALS: Goals reviewed with patient? Yes  SHORT TERM GOALS: Target date: 11/13/2024  Patient will show compliance with initial HEP. Baseline: Goal status: INITIAL  2.  Patient will report pain levels no greater than 5/10 in order to show an improved overall quality of life. Baseline:  Goal status: INITIAL   LONG TERM GOALS: Target date: 12/04/2024  Patient will be independent with final HEP in order to maintain and progress upon functional gains made within PT. Baseline:  Goal status: INITIAL  2.  Patient will report pain levels no greater than 3/10 in order to show an improved overall quality of life. Baseline:  Goal status: INITIAL  3.  Patient will increase PSFS to at least 7 in order to show a significant improvement in subjective disability rating.  Baseline:  Goal status: INITIAL  4.  Patient will improve bilat shoulder flexion AROM to at least 140deg in order to improve functional mobility.  Baseline:  Goal status: INITIAL  5.  Patient will improve bilat shoulder flexion MMT to at least 4+/5 with resistance provided at wrist in order to improve biomechanics with functional mobility. Baseline:  Goal status: INITIAL   PLAN: PT FREQUENCY: 1-2x/week  PT DURATION: 6 weeks  PLANNED INTERVENTIONS: [id number]- PT Re-evaluation, 97750- Physical Performance Testing, 97110-Therapeutic exercises, 97530- Therapeutic activity, [id number]- Neuromuscular re-education, 97535- Self Care, [id number]- Manual therapy, [id number]- Gait training, [phone]- Orthotic Initial, [phone]-  Orthotic/Prosthetic subsequent, [phone]- Canalith repositioning, [phone]- Aquatic Therapy, [phone]- Electrical stimulation (unattended), [phone]- Electrical stimulation (manual), [id number]- Vasopneumatic device, [id number]- Ultrasound, [id number]- Traction (mechanical), [id number]- Ionotophoresis 4mg /ml Dexamethasone , [id number] (1-2 muscles), [id number] (3+ muscles)- Dry Needling, Patient/Family education, Balance training, Stair training, Taping, Joint mobilization, Joint manipulation, Spinal manipulation, Spinal mobilization, Vestibular training, DME instructions, Cryotherapy, and Moist heat  PLAN FOR NEXT SESSION: review and add to HEP, shoulder mobility, shoulder strengthening, postural strengthening    Chanley Mcenery April Ma L [place], PT, DPT 11/01/2024 7:43 AM    "

## 2024-11-08 ENCOUNTER — Ambulatory Visit: Admitting: Physical Therapy

## 2024-11-08 ENCOUNTER — Encounter: Payer: Self-pay | Admitting: Physical Therapy

## 2024-11-08 DIAGNOSIS — M25512 Pain in left shoulder: Secondary | ICD-10-CM

## 2024-11-08 DIAGNOSIS — R293 Abnormal posture: Secondary | ICD-10-CM

## 2024-11-08 DIAGNOSIS — M25511 Pain in right shoulder: Secondary | ICD-10-CM

## 2024-11-08 DIAGNOSIS — M25612 Stiffness of left shoulder, not elsewhere classified: Secondary | ICD-10-CM | POA: Diagnosis not present

## 2024-11-08 DIAGNOSIS — M6281 Muscle weakness (generalized): Secondary | ICD-10-CM | POA: Diagnosis not present

## 2024-11-08 DIAGNOSIS — G8929 Other chronic pain: Secondary | ICD-10-CM | POA: Diagnosis not present

## 2024-11-08 DIAGNOSIS — M25611 Stiffness of right shoulder, not elsewhere classified: Secondary | ICD-10-CM

## 2024-11-08 NOTE — Therapy (Signed)
 " OUTPATIENT PHYSICAL THERAPY TREATMENT   Patient Name: Don Massey MRN: [id number] DOB:May 18, 1956, 69 y.o., male Today's Date: 11/08/2024  END OF SESSION:  PT End of Session - 11/08/24 0759     Visit Number 3    Number of Visits 12    Date for Recertification  12/04/24    Authorization Type [employer] MEDICARE    Progress Note Due on Visit 10    PT Start Time 0800    PT Stop Time 0840    PT Time Calculation (min) 40 min    Activity Tolerance Patient tolerated treatment well    Behavior During Therapy [facility] for tasks assessed/performed           Past Medical History:  Diagnosis Date   Cataracts, bilateral    DVT (deep venous thrombosis) (HCC)    GERD (gastroesophageal reflux disease)    Hyperlipidemia    Peripheral vascular disease    Thrombophlebitis 07/2018   Bilateral legs superficial   Past Surgical History:  Procedure Laterality Date   CATARACT EXTRACTION     CORONARY ULTRASOUND/IVUS Bilateral 08/14/2020   Procedure: Intravascular Ultrasound/IVUS;  Surgeon: Gretta Lonni PARAS, MD;  Location: [facility];  Service: Cardiovascular;  Laterality: Bilateral;  lower legs   ENDOVASCULAR STENT INSERTION  09/05/2020   Procedure: ENDOVASCULAR STENT GRAFT INSERTION INTO INFERIOR VENA CAVA;  Surgeon: Sheree Penne Lonni, MD;  Location: [facility];  Service: Vascular;;   HAND SURGERY Right    Cyst removal   LOWER EXTREMITY VENOGRAPHY Bilateral 08/14/2020   Procedure: LOWER EXTREMITY VENOGRAPHY;  Surgeon: Gretta Lonni PARAS, MD;  Location: [facility] INVAS[facility] CV [facility];  Service: Cardiovascular;  Laterality: Bilateral;   skin grafts     Legs and arms   UMBILICAL HERNIA REPAIR     UMBILICAL HERNIA REPAIR N/A 05/21/2020   Procedure: LAPAROSCOPIC ASSISTED RECURRENT UMBILICAL HERNIA REPAIR WITH MESH;  Surgeon: Gladis Cough, MD;  Location: [facility] ORS;  Service: General;  Laterality: N/A;   VENOGRAM Left 09/05/2020   Procedure: LEFT LOWER EXTREMITY VENOGRAM, RIGHT INTERNAL JUGULAR  APPROACH;  Surgeon: Sheree Penne Lonni, MD;  Location: [facility] OR;  Service: Vascular;  Laterality: Left;   WISDOM TOOTH EXTRACTION     There are no active problems to display for this patient.   PCP: Cheryle Frees, MD  REFERRING PROVIDER: Lonni CINDERELLA Poli, MD   REFERRING DIAG: [phone] (ICD-10-CM) - Chronic left shoulder pain M25.511,G89.29 (ICD-10-CM) - Chronic right shoulder pain M75.42 (ICD-10-CM) - Impingement syndrome of left shoulder M75.41 (ICD-10-CM) - Impingement syndrome of right shoulder  THERAPY DIAG:  Chronic pain of both shoulders  Stiffness of left shoulder, not elsewhere classified  Stiffness of right shoulder, not elsewhere classified  Abnormal posture  Muscle weakness (generalized)  Rationale for Evaluation and Treatment: Rehabilitation  ONSET DATE: at least 1 year   SUBJECTIVE:  SUBJECTIVE STATEMENT: Pt states W is the most uncomfortable. Pt reports the shoulder abd on the table is the second most uncomfortable.  Hand dominance: Right  PERTINENT HISTORY: Patient reports pain in Lt>Rt shoulder pain that has been chronic ongoing pain. Patient reports that pain started during sleep about a year ago which then started to hurt throughout the day 6-9 months ago. Patient endorses receiving cortisone shots in bilateral shoulders that has not provided relief and returns to MD on 10/25/2024 for another round of injections. No other traumas, surgeries, or injuries noted in the area or surrounding joints. Patient reports ruptured disc in low back as well as history of blood clots and taking daily aspirin.   See PMH or personal factors for in depth comorbidities   PAIN:  Are you having pain? Yes: NPRS scale: 0/10 at rest, 5-7/10 at worst  Pain location: Lt>Rt, lateral upper arm,  generalized shoulder pain  Pain description: sharp, intermittent short throbbing  Aggravating factors: putting on a jacket, putting on deodorant, any arm mobility (up, out) Relieving factors: not doing the painful movements  PRECAUTIONS: None  RED FLAGS: Bowel or bladder incontinence: No and Cauda equina syndrome: No   WEIGHT BEARING RESTRICTIONS: No  FALLS:  Has patient fallen in last 6 months? No  LIVING ENVIRONMENT: Lives with: lives with their spouse Lives in: House/apartment Stairs: Yes: Internal: 15 steps; on left going up and External: 2 steps; none Has following equipment at home: None  OCCUPATION: Retired, corporate treasurer   PLOF: Independent  PATIENT GOALS: alleviate pain   NEXT MD VISIT: 10/25/2024 with Dr. Vernetta   OBJECTIVE:  Note: Objective measures were completed at Evaluation unless otherwise noted.  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  The humeral head is well located and not high riding   Lt: 3 views of the left shoulder show moderate glenohumeral arthritis and  moderate AC joint arthritis.  The humeral head is well located.   PATIENT SURVEYS :  PSFS: THE PATIENT SPECIFIC FUNCTIONAL SCALE  Place score of 0-10 (0 = unable to perform activity and 10 = able to perform activity at the same level as before injury or problem)  Activity Date: 10/23/2024    Lifting arm above head without pain 5    2. Put on a jacket without pain  5    3. Put on deodorant without pain  5    4.      Total Score 5      Total Score = Sum of activity scores/number of activities  Minimally Detectable Change: 3 points (for single activity); 2 points (for average score)  [facility] (nd). The Patient Specific Functional Scale . Retrieved from [email or web address]   COGNITION: Overall cognitive status: Within functional limits  for tasks assessed     SENSATION: Not tested , subjectively negative   POSTURE: Rounded shoulders, increased thoracic kyphosis, forward head   UPPER EXTREMITY ROM: highly painful throughout all motions   Active ROM Right Eval 10/23/2024 Left Eval 10/23/2024  Shoulder flexion (seated) AROM: 125deg , painful PROM: 138deg   AROM: 124deg PROM: 132deg  Shoulder extension    Shoulder abduction (seated) AROM: WFL , painful AROM: WFL, painful   Shoulder adduction    Shoulder internal rotation (seated) T2 T1  Shoulder external rotation (seated) L1 L1  Elbow flexion    Elbow extension    Wrist flexion    Wrist  extension    Wrist ulnar deviation    Wrist radial deviation    Wrist pronation    Wrist supination    (Blank rows = not tested)  UPPER EXTREMITY MMT: painful in abduction and flexion more than ER/IR   MMT Right Eval 10/23/2024 Left Eval 10/23/2024  Shoulder flexion (seated) 4/5 (at elbow)   4-/5 (at wrist) 4/5 (at elbow)   4-/5 (at wrist)  Shoulder extension    Shoulder abduction (seated) 4/5 4/5  Shoulder adduction    Shoulder internal rotation (seated) 4+/5 4+/5  Shoulder external rotation (seated) 4-/5 4-/5  Middle trapezius    Lower trapezius    Elbow flexion    Elbow extension    Wrist flexion    Wrist extension    Wrist ulnar deviation    Wrist radial deviation    Wrist pronation    Wrist supination    Grip strength (lbs)    (Blank rows = not tested)  SHOULDER SPECIAL TESTS: Impingement tests: Painful arc test: positive    PALPATION:  Not TTP in bilat bicep, tricep, scapular musculature, deltoids, rhomboids, or UT  Not tender with ACJ compression                                                                                                                              TREATMENT DATE:  11/08/24 Therex: Seated shoulder pulleys flexion, scaption, horizontal abduction, ER at 80 deg abd x3 min each  Manual therapy: Grade II to III glenohumeral joint  mobs PA/AP Grade II to III glenohumeral joint mobs inferior  Neuro Re-ed: Prone scap retraction 2x10 Prone IR hands reaching behind hip x10 Prone T 2x10 with PT assist Prone W 2x10 with PT assist Prone Y 2x10 with PT assist/cueing   11/01/24 Therex: Seated shoulder pulleys scaption, abduction x2 min each Seated UT stretch x30 R&L Seated scap squeeze 2x10 Seated shoulder ER red TB 2x10 Seated thoracic ext against ball x10 Standing shoulder IR/ER AAROM with dowel up/down back x10  Therapeutic Activity: Standing row green TB 2x10 Standing shoulder ext green TB 2x10 Shoulder flexion AAROM on table 2x10 Shoulder scaption AAROM on table 2x10 Sitting W 2x10   10/23/2024 TherEx:  HEP handout and yellow TB provided with patient performing one set of each activity for appropriate form. Verbal and tactile cues provided.   Self-Care:  POC and postural implications with shoulder pain   PATIENT EDUCATION: Education details: HEP, POC, posture  Person educated: Patient Education method: Explanation, Demonstration, Tactile cues, Verbal cues, and Handouts Education comprehension: verbalized understanding, returned demonstration, verbal cues required, and tactile cues required  HOME EXERCISE PROGRAM: Access Code: [email or web address] URL: [email or web address] Date: 10/23/2024 Prepared by: Susannah Daring  Exercises - Seated Scapular Retraction  - 1 x daily - 7 x weekly - 2 sets - 10 reps - 3-5sec hold - Seated Upper Trapezius Stretch  - 1 x daily - 7 x weekly - 2 sets - Supine Shoulder  Flexion Extension AAROM with Dowel  - 1 x daily - 7 x weekly - 2 sets - 5 reps - 5-10sec hold - Shoulder External Rotation and Scapular Retraction with Resistance  - 1 x daily - 7 x weekly - 2 sets - 10 reps - 2-3sec hold  ASSESSMENT:  CLINICAL IMPRESSION: Treatment continues to focus on improving glenohumeral mobility and improve periscapular strength and coordination.   From eval:  Patient is a 69 y.o. M who was seen today for physical therapy evaluation and treatment for chronic bilat shoulder pain presenting with deficits in motor coordination, strength, functional mobility, ROM, posture, and pain. Patient is mainly limited secondary to pain with functional mobility and ROM. Patient will benefit from skilled PT to address above noted deficits.  OBJECTIVE IMPAIRMENTS: decreased coordination, decreased ROM, decreased strength, impaired flexibility, improper body mechanics, postural dysfunction, and pain.   ACTIVITY LIMITATIONS: carrying, lifting, bed mobility, dressing, reach over head, and hygiene/grooming  PARTICIPATION LIMITATIONS: cleaning, community activity, and yard work  PERSONAL FACTORS: Past/current experiences, Time since onset of injury/illness/exacerbation, and 3+ comorbidities: GERD, cataracts, HLD, PVD, history of DVT are also affecting patient's functional outcome.   REHAB POTENTIAL: Good  CLINICAL DECISION MAKING: Stable/uncomplicated  EVALUATION COMPLEXITY: Low  GOALS: Goals reviewed with patient? Yes  SHORT TERM GOALS: Target date: 11/13/2024  Patient will show compliance with initial HEP. Baseline: Goal status: INITIAL  2.  Patient will report pain levels no greater than 5/10 in order to show an improved overall quality of life. Baseline:  Goal status: INITIAL   LONG TERM GOALS: Target date: 12/04/2024  Patient will be independent with final HEP in order to maintain and progress upon functional gains made within PT. Baseline:  Goal status: INITIAL  2.  Patient will report pain levels no greater than 3/10 in order to show an improved overall quality of life. Baseline:  Goal status: INITIAL  3.  Patient will increase PSFS to at least 7 in order to show a significant improvement in subjective disability rating.  Baseline:  Goal status: INITIAL  4.  Patient will improve bilat shoulder flexion AROM to at least 140deg in order to improve  functional mobility.  Baseline:  Goal status: INITIAL  5.  Patient will improve bilat shoulder flexion MMT to at least 4+/5 with resistance provided at wrist in order to improve biomechanics with functional mobility. Baseline:  Goal status: INITIAL   PLAN: PT FREQUENCY: 1-2x/week  PT DURATION: 6 weeks  PLANNED INTERVENTIONS: 97164- PT Re-evaluation, 97750- Physical Performance Testing, 97110-Therapeutic exercises, 97530- Therapeutic activity, [id number]- Neuromuscular re-education, 97535- Self Care, [id number]- Manual therapy, [id number]- Gait training, [phone]- Orthotic Initial, [phone]- Orthotic/Prosthetic subsequent, [phone]- Canalith repositioning, [phone]- Aquatic Therapy, [phone]- Electrical stimulation (unattended), [phone]- Electrical stimulation (manual), [id number]- Vasopneumatic device, [id number]- Ultrasound, [id number]- Traction (mechanical), [id number]- Ionotophoresis 4mg /ml Dexamethasone , 79439 (1-2 muscles), [id number] (3+ muscles)- Dry Needling, Patient/Family education, Balance training, Stair training, Taping, Joint mobilization, Joint manipulation, Spinal manipulation, Spinal mobilization, Vestibular training, DME instructions, Cryotherapy, and Moist heat  PLAN FOR NEXT SESSION: review and add to HEP, shoulder mobility, shoulder strengthening, postural strengthening    Saiya Crist April Ma L [place], PT, DPT 11/08/24 8:32 AM    "

## 2024-11-15 ENCOUNTER — Encounter: Admitting: Physical Therapy

## 2024-11-22 ENCOUNTER — Encounter

## 2024-11-22 ENCOUNTER — Encounter: Payer: Self-pay | Admitting: Physical Therapy

## 2024-11-22 ENCOUNTER — Ambulatory Visit: Payer: Self-pay | Admitting: Physical Therapy

## 2024-11-22 DIAGNOSIS — G8929 Other chronic pain: Secondary | ICD-10-CM | POA: Diagnosis not present

## 2024-11-22 DIAGNOSIS — M25612 Stiffness of left shoulder, not elsewhere classified: Secondary | ICD-10-CM

## 2024-11-22 DIAGNOSIS — M25512 Pain in left shoulder: Secondary | ICD-10-CM | POA: Diagnosis not present

## 2024-11-22 DIAGNOSIS — M6281 Muscle weakness (generalized): Secondary | ICD-10-CM

## 2024-11-22 DIAGNOSIS — M25511 Pain in right shoulder: Secondary | ICD-10-CM | POA: Diagnosis not present

## 2024-11-22 DIAGNOSIS — M25611 Stiffness of right shoulder, not elsewhere classified: Secondary | ICD-10-CM | POA: Diagnosis not present

## 2024-11-22 DIAGNOSIS — R293 Abnormal posture: Secondary | ICD-10-CM

## 2024-11-22 NOTE — Therapy (Signed)
 " OUTPATIENT PHYSICAL THERAPY TREATMENT   Patient Name: Don Massey MRN: [id number] DOB:10-22-1955, 69 y.o., male Today's Date: 11/22/2024  END OF SESSION:  PT End of Session - 11/22/24 1057     Visit Number 4    Number of Visits 12    Date for Recertification  12/04/24    Authorization Type BCBS MEDICARE    Progress Note Due on Visit 10    PT Start Time 1100    PT Stop Time 1140    PT Time Calculation (min) 40 min    Activity Tolerance Patient tolerated treatment well    Behavior During Therapy [facility] for tasks assessed/performed            Past Medical History:  Diagnosis Date   Cataracts, bilateral    DVT (deep venous thrombosis) (HCC)    GERD (gastroesophageal reflux disease)    Hyperlipidemia    Peripheral vascular disease    Thrombophlebitis 07/2018   Bilateral legs superficial   Past Surgical History:  Procedure Laterality Date   CATARACT EXTRACTION     CORONARY ULTRASOUND/IVUS Bilateral 08/14/2020   Procedure: Intravascular Ultrasound/IVUS;  Surgeon: Gretta Lonni PARAS, MD;  Location: [facility];  Service: Cardiovascular;  Laterality: Bilateral;  lower legs   ENDOVASCULAR STENT INSERTION  09/05/2020   Procedure: ENDOVASCULAR STENT GRAFT INSERTION INTO INFERIOR VENA CAVA;  Surgeon: Sheree Penne Lonni, MD;  Location: [facility] OR;  Service: Vascular;;   HAND SURGERY Right    Cyst removal   LOWER EXTREMITY VENOGRAPHY Bilateral 08/14/2020   Procedure: LOWER EXTREMITY VENOGRAPHY;  Surgeon: Gretta Lonni PARAS, MD;  Location: [facility];  Service: Cardiovascular;  Laterality: Bilateral;   skin grafts     Legs and arms   UMBILICAL HERNIA REPAIR     UMBILICAL HERNIA REPAIR N/A 05/21/2020   Procedure: LAPAROSCOPIC ASSISTED RECURRENT UMBILICAL HERNIA REPAIR WITH MESH;  Surgeon: Gladis Cough, MD;  Location: [facility] ORS;  Service: General;  Laterality: N/A;   VENOGRAM Left 09/05/2020   Procedure: LEFT LOWER EXTREMITY VENOGRAM, RIGHT INTERNAL JUGULAR  APPROACH;  Surgeon: Sheree Penne Lonni, MD;  Location: [facility] OR;  Service: Vascular;  Laterality: Left;   WISDOM TOOTH EXTRACTION     There are no active problems to display for this patient.   PCP: Cheryle Frees, MD  REFERRING PROVIDER: Lonni CINDERELLA Poli, MD   REFERRING DIAG: [phone] (ICD-10-CM) - Chronic left shoulder pain M25.511,G89.29 (ICD-10-CM) - Chronic right shoulder pain M75.42 (ICD-10-CM) - Impingement syndrome of left shoulder M75.41 (ICD-10-CM) - Impingement syndrome of right shoulder  THERAPY DIAG:  Chronic pain of both shoulders  Stiffness of left shoulder, not elsewhere classified  Stiffness of right shoulder, not elsewhere classified  Abnormal posture  Muscle weakness (generalized)  Rationale for Evaluation and Treatment: Rehabilitation  ONSET DATE: at least 1 year   SUBJECTIVE:  SUBJECTIVE STATEMENT: Pt states W is the most uncomfortable. Pt reports the shoulder abd on the table is the second most uncomfortable.  Hand dominance: Right  PERTINENT HISTORY: Patient reports pain in Lt>Rt shoulder pain that has been chronic ongoing pain. Patient reports that pain started during sleep about a year ago which then started to hurt throughout the day 6-9 months ago. Patient endorses receiving cortisone shots in bilateral shoulders that has not provided relief and returns to MD on 10/25/2024 for another round of injections. No other traumas, surgeries, or injuries noted in the area or surrounding joints. Patient reports ruptured disc in low back as well as history of blood clots and taking daily aspirin.   See PMH or personal factors for in depth comorbidities   PAIN:  Are you having pain? Yes: NPRS scale: 0/10 at rest, 5-7/10 at worst  Pain location: Lt>Rt, lateral upper arm,  generalized shoulder pain  Pain description: sharp, intermittent short throbbing  Aggravating factors: putting on a jacket, putting on deodorant, any arm mobility (up, out) Relieving factors: not doing the painful movements  PRECAUTIONS: None  RED FLAGS: Bowel or bladder incontinence: No and Cauda equina syndrome: No   WEIGHT BEARING RESTRICTIONS: No  FALLS:  Has patient fallen in last 6 months? No  LIVING ENVIRONMENT: Lives with: lives with their spouse Lives in: House/apartment Stairs: Yes: Internal: 15 steps; on left going up and External: 2 steps; none Has following equipment at home: None  OCCUPATION: Retired, corporate treasurer   PLOF: Independent  PATIENT GOALS: alleviate pain   NEXT MD VISIT: 10/25/2024 with Dr. Vernetta   OBJECTIVE:  Note: Objective measures were completed at Evaluation unless otherwise noted.  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  The humeral head is well located and not high riding   Lt: 3 views of the left shoulder show moderate glenohumeral arthritis and  moderate AC joint arthritis.  The humeral head is well located.   PATIENT SURVEYS :  PSFS: THE PATIENT SPECIFIC FUNCTIONAL SCALE  Place score of 0-10 (0 = unable to perform activity and 10 = able to perform activity at the same level as before injury or problem)  Activity Date: 10/23/2024    Lifting arm above head without pain 5    2. Put on a jacket without pain  5    3. Put on deodorant without pain  5    4.      Total Score 5      Total Score = Sum of activity scores/number of activities  Minimally Detectable Change: 3 points (for single activity); 2 points (for average score)  [facility] (nd). The Patient Specific Functional Scale . Retrieved from [email or web address]   COGNITION: Overall cognitive status: Within functional limits  for tasks assessed     SENSATION: Not tested , subjectively negative   POSTURE: Rounded shoulders, increased thoracic kyphosis, forward head   UPPER EXTREMITY ROM: highly painful throughout all motions   Active ROM Right Eval 10/23/2024 Left Eval 10/23/2024  Shoulder flexion (seated) AROM: 125deg , painful PROM: 138deg   AROM: 124deg PROM: 132deg  Shoulder extension    Shoulder abduction (seated) AROM: WFL , painful AROM: WFL, painful   Shoulder adduction    Shoulder internal rotation (seated) T2 T1  Shoulder external rotation (seated) L1 L1  Elbow flexion    Elbow extension    Wrist flexion    Wrist  extension    Wrist ulnar deviation    Wrist radial deviation    Wrist pronation    Wrist supination    (Blank rows = not tested)  UPPER EXTREMITY MMT: painful in abduction and flexion more than ER/IR   MMT Right Eval 10/23/2024 Left Eval 10/23/2024  Shoulder flexion (seated) 4/5 (at elbow)   4-/5 (at wrist) 4/5 (at elbow)   4-/5 (at wrist)  Shoulder extension    Shoulder abduction (seated) 4/5 4/5  Shoulder adduction    Shoulder internal rotation (seated) 4+/5 4+/5  Shoulder external rotation (seated) 4-/5 4-/5  Middle trapezius    Lower trapezius    Elbow flexion    Elbow extension    Wrist flexion    Wrist extension    Wrist ulnar deviation    Wrist radial deviation    Wrist pronation    Wrist supination    Grip strength (lbs)    (Blank rows = not tested)  SHOULDER SPECIAL TESTS: Impingement tests: Painful arc test: positive    PALPATION:  Not TTP in bilat bicep, tricep, scapular musculature, deltoids, rhomboids, or UT  Not tender with ACJ compression                                                                                                                              TREATMENT DATE:  11/22/24 Therex: Seated shoulder pulleys flexion, scaption, horizontal abduction, ER at 80 deg abd x2 min each Seated shoulder ER at 80 deg abd 1# 2x10  Manual  therapy: Grade II to III glenohumeral joint mobs PA/AP Grade II to III glenohumeral joint mobs inferior  Neuro Re-ed: Supine shoulder PROM scaption with cues for scap stabilization 2x10 to point of discomfort Sidelying shoulder scaption with PT assist for scap stabilization 2x10 to point of discomfort Sidelying shoulder IR 2x10 Sidelying shoulder ER 2x10 Prone T 2x10 with PT assist/cueing for scapular positioning Prone W 2x10 with PT assist for scapular positioning Prone Y 2x10 with PT assist/cueing for scapular positioning Prone hands behind head scap squueze 2x10    11/08/24 Therex: Seated shoulder pulleys flexion, scaption, horizontal abduction, ER at 80 deg abd x3 min each  Manual therapy: Grade II to III glenohumeral joint mobs PA/AP Grade II to III glenohumeral joint mobs inferior  Neuro Re-ed: Prone scap retraction 2x10 Prone IR hands reaching behind hip x10 Prone T 2x10 with PT assist Prone W 2x10 with PT assist Prone Y 2x10 with PT assist/cueing   11/01/24 Therex: Seated shoulder pulleys scaption, abduction x2 min each Seated UT stretch x30 R&L Seated scap squeeze 2x10 Seated shoulder ER red TB 2x10 Seated thoracic ext against ball x10 Standing shoulder IR/ER AAROM with dowel up/down back x10  Therapeutic Activity: Standing row green TB 2x10 Standing shoulder ext green TB 2x10 Shoulder flexion AAROM on table 2x10 Shoulder scaption AAROM on table 2x10 Sitting W 2x10   10/23/2024 TherEx:  HEP handout and yellow TB provided with  patient performing one set of each activity for appropriate form. Verbal and tactile cues provided.   Self-Care:  POC and postural implications with shoulder pain   PATIENT EDUCATION: Education details: HEP, POC, posture  Person educated: Patient Education method: Explanation, Demonstration, Tactile cues, Verbal cues, and Handouts Education comprehension: verbalized understanding, returned demonstration, verbal  cues required, and tactile cues required  HOME EXERCISE PROGRAM: Access Code: [email or web address] URL: [email or web address] Date: 10/23/2024 Prepared by: Susannah Daring  Exercises - Seated Scapular Retraction  - 1 x daily - 7 x weekly - 2 sets - 10 reps - 3-5sec hold - Seated Upper Trapezius Stretch  - 1 x daily - 7 x weekly - 2 sets - Supine Shoulder Flexion Extension AAROM with Dowel  - 1 x daily - 7 x weekly - 2 sets - 5 reps - 5-10sec hold - Shoulder External Rotation and Scapular Retraction with Resistance  - 1 x daily - 7 x weekly - 2 sets - 10 reps - 2-3sec hold  ASSESSMENT:  CLINICAL IMPRESSION: Treatment continues to focus on improving glenohumeral mobility and improve periscapular strength and coordination. W appears more symmetrical bilat. Remains hypomobile with ant GH mobilizations affecting ER.  From eval: Patient is a 69 y.o. M who was seen today for physical therapy evaluation and treatment for chronic bilat shoulder pain presenting with deficits in motor coordination, strength, functional mobility, ROM, posture, and pain. Patient is mainly limited secondary to pain with functional mobility and ROM. Patient will benefit from skilled PT to address above noted deficits.  OBJECTIVE IMPAIRMENTS: decreased coordination, decreased ROM, decreased strength, impaired flexibility, improper body mechanics, postural dysfunction, and pain.   ACTIVITY LIMITATIONS: carrying, lifting, bed mobility, dressing, reach over head, and hygiene/grooming  PARTICIPATION LIMITATIONS: cleaning, community activity, and yard work  PERSONAL FACTORS: Past/current experiences, Time since onset of injury/illness/exacerbation, and 3+ comorbidities: GERD, cataracts, HLD, PVD, history of DVT are also affecting patient's functional outcome.   REHAB POTENTIAL: Good  CLINICAL DECISION MAKING: Stable/uncomplicated  EVALUATION COMPLEXITY: Low  GOALS: Goals reviewed with patient? Yes  SHORT TERM  GOALS: Target date: 11/13/2024  Patient will show compliance with initial HEP. Baseline: Goal status: MET  2.  Patient will report pain levels no greater than 5/10 in order to show an improved overall quality of life. Baseline:  Goal status: IN PROGRESS   LONG TERM GOALS: Target date: 12/04/2024  Patient will be independent with final HEP in order to maintain and progress upon functional gains made within PT. Baseline:  Goal status: INITIAL  2.  Patient will report pain levels no greater than 3/10 in order to show an improved overall quality of life. Baseline:  Goal status: INITIAL  3.  Patient will increase PSFS to at least 7 in order to show a significant improvement in subjective disability rating.  Baseline:  Goal status: INITIAL  4.  Patient will improve bilat shoulder flexion AROM to at least 140deg in order to improve functional mobility.  Baseline:  Goal status: INITIAL  5.  Patient will improve bilat shoulder flexion MMT to at least 4+/5 with resistance provided at wrist in order to improve biomechanics with functional mobility. Baseline:  Goal status: INITIAL   PLAN: PT FREQUENCY: 1-2x/week  PT DURATION: 6 weeks  PLANNED INTERVENTIONS: 97164- PT Re-evaluation, 97750- Physical Performance Testing, 97110-Therapeutic exercises, 97530- Therapeutic activity, [id number]- Neuromuscular re-education, 97535- Self Care, 02859- Manual therapy, [id number]- Gait training, [id number]- Orthotic Initial, [id number]- Orthotic/Prosthetic subsequent, [phone]- Canalith repositioning, [id number]- Aquatic Therapy, H9716- Electrical stimulation (unattended),  02967- Electrical stimulation (manual), 02983- Vasopneumatic device, [id number]- Ultrasound, [id number]- Traction (mechanical), 02966- Ionotophoresis 4mg /ml Dexamethasone , 79439 (1-2 muscles), 20561 (3+ muscles)- Dry Needling, Patient/Family education, Balance training, Stair training, Taping, Joint mobilization, Joint manipulation, Spinal manipulation, Spinal mobilization,  Vestibular training, DME instructions, Cryotherapy, and Moist heat  PLAN FOR NEXT SESSION: review and add to HEP, shoulder mobility, shoulder strengthening, postural strengthening    Gracielynn Birkel April Ma L Max Nuno, PT, DPT 11/22/24 11:26 AM    "

## 2024-11-29 ENCOUNTER — Encounter: Admitting: Physical Therapy

## 2024-12-06 ENCOUNTER — Ambulatory Visit: Admitting: Orthopaedic Surgery

## 2025-01-31 ENCOUNTER — Ambulatory Visit (HOSPITAL_COMMUNITY)

## 2025-01-31 ENCOUNTER — Ambulatory Visit
